# Patient Record
Sex: Female | Born: 1960 | Race: Black or African American | Hispanic: No | Marital: Married | Smoking: Never smoker
Health system: Southern US, Community
[De-identification: ages and names within clinical notes are randomized; demographics above are authoritative.]

## PROBLEM LIST (undated history)

## (undated) DIAGNOSIS — D649 Anemia, unspecified: Secondary | ICD-10-CM

## (undated) DIAGNOSIS — A159 Respiratory tuberculosis unspecified: Secondary | ICD-10-CM

## (undated) DIAGNOSIS — K219 Gastro-esophageal reflux disease without esophagitis: Secondary | ICD-10-CM

---

## 2000-06-09 ENCOUNTER — Emergency Department (HOSPITAL_COMMUNITY): Admission: EM | Admit: 2000-06-09 | Discharge: 2000-06-09 | Payer: Self-pay | Admitting: Emergency Medicine

## 2002-09-11 ENCOUNTER — Other Ambulatory Visit: Admission: RE | Admit: 2002-09-11 | Discharge: 2002-09-11 | Payer: Self-pay | Admitting: Gynecology

## 2003-05-14 ENCOUNTER — Emergency Department (HOSPITAL_COMMUNITY): Admission: EM | Admit: 2003-05-14 | Discharge: 2003-05-14 | Payer: Self-pay | Admitting: Emergency Medicine

## 2004-08-11 ENCOUNTER — Other Ambulatory Visit: Admission: RE | Admit: 2004-08-11 | Discharge: 2004-08-11 | Payer: Self-pay | Admitting: Gynecology

## 2004-08-18 ENCOUNTER — Encounter: Admission: RE | Admit: 2004-08-18 | Discharge: 2004-08-18 | Payer: Self-pay | Admitting: Internal Medicine

## 2006-01-17 ENCOUNTER — Other Ambulatory Visit: Admission: RE | Admit: 2006-01-17 | Discharge: 2006-01-17 | Payer: Self-pay | Admitting: Internal Medicine

## 2006-02-08 ENCOUNTER — Encounter: Admission: RE | Admit: 2006-02-08 | Discharge: 2006-02-08 | Payer: Self-pay | Admitting: Internal Medicine

## 2007-11-20 ENCOUNTER — Other Ambulatory Visit: Admission: RE | Admit: 2007-11-20 | Discharge: 2007-11-20 | Payer: Self-pay | Admitting: Internal Medicine

## 2007-12-06 ENCOUNTER — Encounter: Admission: RE | Admit: 2007-12-06 | Discharge: 2007-12-06 | Payer: Self-pay | Admitting: Internal Medicine

## 2008-11-20 ENCOUNTER — Other Ambulatory Visit: Admission: RE | Admit: 2008-11-20 | Discharge: 2008-11-20 | Payer: Self-pay | Admitting: Internal Medicine

## 2009-02-12 ENCOUNTER — Encounter: Admission: RE | Admit: 2009-02-12 | Discharge: 2009-02-12 | Payer: Self-pay | Admitting: Internal Medicine

## 2010-06-03 ENCOUNTER — Emergency Department (HOSPITAL_COMMUNITY): Admission: EM | Admit: 2010-06-03 | Discharge: 2010-06-03 | Payer: Self-pay | Admitting: Emergency Medicine

## 2010-06-23 ENCOUNTER — Encounter: Admission: RE | Admit: 2010-06-23 | Discharge: 2010-06-23 | Payer: Self-pay | Admitting: Internal Medicine

## 2010-07-14 ENCOUNTER — Emergency Department (HOSPITAL_COMMUNITY): Admission: EM | Admit: 2010-07-14 | Discharge: 2010-07-15 | Payer: Self-pay | Admitting: Emergency Medicine

## 2010-09-11 ENCOUNTER — Encounter
Admission: RE | Admit: 2010-09-11 | Discharge: 2010-09-11 | Payer: Self-pay | Source: Home / Self Care | Attending: Internal Medicine | Admitting: Internal Medicine

## 2010-12-17 ENCOUNTER — Other Ambulatory Visit (HOSPITAL_COMMUNITY)
Admission: RE | Admit: 2010-12-17 | Discharge: 2010-12-17 | Disposition: A | Discharge status: Home / Self Care | Payer: Self-pay | Source: Ambulatory Visit | Attending: Obstetrics and Gynecology | Admitting: Obstetrics and Gynecology

## 2010-12-17 ENCOUNTER — Other Ambulatory Visit: Payer: Self-pay | Admitting: Obstetrics and Gynecology

## 2010-12-17 DIAGNOSIS — Z01419 Encounter for gynecological examination (general) (routine) without abnormal findings: Secondary | ICD-10-CM | POA: Insufficient documentation

## 2010-12-17 LAB — H. PYLORI ANTIBODY, IGG: H Pylori IgG: <0.4 {ISR}

## 2010-12-17 LAB — MAGNESIUM: Magnesium: 2 mg/dL (ref 1.5–2.5)

## 2010-12-17 LAB — POCT CARDIAC MARKERS
CKMB, poc: 1.7 ng/mL (ref 1.0–8.0)
CKMB, poc: 1.8 ng/mL (ref 1.0–8.0)
Myoglobin, poc: 106 ng/mL (ref 12–200)
Myoglobin, poc: 85.4 ng/mL (ref 12–200)
Troponin i, poc: <0.05 ng/mL (ref 0.00–0.09)
Troponin i, poc: <0.05 ng/mL (ref 0.00–0.09)

## 2010-12-17 LAB — COMPREHENSIVE METABOLIC PANEL
ALT: 14 U/L (ref 0–35)
AST: 24 U/L (ref 0–37)
Albumin: 3.5 g/dL (ref 3.5–5.2)
Alkaline Phosphatase: 72 U/L (ref 39–117)
BUN: 13 mg/dL (ref 6–23)
CO2: 28 mEq/L (ref 19–32)
Calcium: 9.3 mg/dL (ref 8.4–10.5)
Chloride: 105 mEq/L (ref 96–112)
Creatinine, Ser: 0.93 mg/dL (ref 0.4–1.2)
GFR calc Af Amer: >60 mL/min (ref >60)
GFR calc non Af Amer: >60 mL/min (ref >60)
Glucose, Bld: 118 mg/dL — ABNORMAL HIGH (ref 70–99)
Potassium: 4.1 mEq/L (ref 3.5–5.1)
Sodium: 137 mEq/L (ref 135–145)
Total Bilirubin: 0.6 mg/dL (ref 0.3–1.2)
Total Protein: 7.2 g/dL (ref 6.0–8.3)

## 2010-12-17 LAB — DIFFERENTIAL
Basophils Absolute: 0 10*3/uL (ref 0.0–0.1)
Basophils Relative: 0 % (ref 0–1)
Eosinophils Absolute: 0.1 10*3/uL (ref 0.0–0.7)
Eosinophils Relative: 2 % (ref 0–5)
Lymphocytes Relative: 34 % (ref 12–46)
Lymphs Abs: 2.8 10*3/uL (ref 0.7–4.0)
Monocytes Absolute: 0.6 10*3/uL (ref 0.1–1.0)
Monocytes Relative: 7 % (ref 3–12)
Neutro Abs: 4.8 10*3/uL (ref 1.7–7.7)
Neutrophils Relative %: 58 % (ref 43–77)

## 2010-12-17 LAB — CBC
HCT: 38.8 % (ref 36.0–46.0)
Hemoglobin: 12.9 g/dL (ref 12.0–15.0)
MCH: 28.9 pg (ref 26.0–34.0)
MCHC: 33.2 g/dL (ref 30.0–36.0)
MCV: 86.8 fL (ref 78.0–100.0)
Platelets: 198 10*3/uL (ref 150–400)
RBC: 4.47 MIL/uL (ref 3.87–5.11)
RDW: 13.5 % (ref 11.5–15.5)
WBC: 8.2 10*3/uL (ref 4.0–10.5)

## 2010-12-18 LAB — DIFFERENTIAL
Basophils Absolute: 0 10*3/uL (ref 0.0–0.1)
Basophils Relative: 1 % (ref 0–1)
Eosinophils Absolute: 0.1 10*3/uL (ref 0.0–0.7)
Eosinophils Relative: 1 % (ref 0–5)
Lymphocytes Relative: 42 % (ref 12–46)
Lymphs Abs: 2.6 10*3/uL (ref 0.7–4.0)
Monocytes Absolute: 0.4 10*3/uL (ref 0.1–1.0)
Monocytes Relative: 7 % (ref 3–12)
Neutro Abs: 3 10*3/uL (ref 1.7–7.7)
Neutrophils Relative %: 49 % (ref 43–77)

## 2010-12-18 LAB — COMPREHENSIVE METABOLIC PANEL
ALT: 17 U/L (ref 0–35)
AST: 24 U/L (ref 0–37)
Albumin: 3.7 g/dL (ref 3.5–5.2)
Alkaline Phosphatase: 69 U/L (ref 39–117)
BUN: 13 mg/dL (ref 6–23)
CO2: 31 mEq/L (ref 19–32)
Calcium: 9.6 mg/dL (ref 8.4–10.5)
Chloride: 106 mEq/L (ref 96–112)
Creatinine, Ser: 1.01 mg/dL (ref 0.4–1.2)
GFR calc Af Amer: >60 mL/min (ref >60)
GFR calc non Af Amer: 58 mL/min — ABNORMAL LOW (ref >60)
Glucose, Bld: 99 mg/dL (ref 70–99)
Potassium: 4.1 mEq/L (ref 3.5–5.1)
Sodium: 141 mEq/L (ref 135–145)
Total Bilirubin: 0.8 mg/dL (ref 0.3–1.2)
Total Protein: 7.1 g/dL (ref 6.0–8.3)

## 2010-12-18 LAB — LIPASE, BLOOD: Lipase: 26 U/L (ref 11–59)

## 2010-12-18 LAB — POCT CARDIAC MARKERS
CKMB, poc: 1.8 ng/mL (ref 1.0–8.0)
Myoglobin, poc: 83 ng/mL (ref 12–200)
Troponin i, poc: <0.05 ng/mL (ref 0.00–0.09)

## 2010-12-18 LAB — CBC
HCT: 36.6 % (ref 36.0–46.0)
Hemoglobin: 12.4 g/dL (ref 12.0–15.0)
MCH: 29.3 pg (ref 26.0–34.0)
MCHC: 33.9 g/dL (ref 30.0–36.0)
MCV: 86.2 fL (ref 78.0–100.0)
Platelets: 228 10*3/uL (ref 150–400)
RBC: 4.25 MIL/uL (ref 3.87–5.11)
RDW: 14.8 % (ref 11.5–15.5)
WBC: 6.2 10*3/uL (ref 4.0–10.5)

## 2011-10-04 ENCOUNTER — Other Ambulatory Visit: Payer: Self-pay | Admitting: Internal Medicine

## 2011-10-04 DIAGNOSIS — Z1231 Encounter for screening mammogram for malignant neoplasm of breast: Secondary | ICD-10-CM

## 2011-10-08 ENCOUNTER — Ambulatory Visit
Admission: RE | Admit: 2011-10-08 | Discharge: 2011-10-08 | Disposition: A | Discharge status: Home / Self Care | Payer: Commercial Indemnity | Source: Ambulatory Visit | Attending: Internal Medicine | Admitting: Internal Medicine

## 2011-10-08 DIAGNOSIS — Z1231 Encounter for screening mammogram for malignant neoplasm of breast: Secondary | ICD-10-CM

## 2013-05-02 ENCOUNTER — Encounter (HOSPITAL_COMMUNITY): Payer: Self-pay | Admitting: *Deleted

## 2013-05-02 ENCOUNTER — Emergency Department (HOSPITAL_COMMUNITY): Payer: BC Managed Care – PPO

## 2013-05-02 ENCOUNTER — Emergency Department (HOSPITAL_COMMUNITY)
Admission: EM | Admit: 2013-05-02 | Discharge: 2013-05-02 | Disposition: A | Discharge status: Home / Self Care | Payer: BC Managed Care – PPO | Attending: Emergency Medicine | Admitting: Emergency Medicine

## 2013-05-02 DIAGNOSIS — Z8709 Personal history of other diseases of the respiratory system: Secondary | ICD-10-CM | POA: Insufficient documentation

## 2013-05-02 DIAGNOSIS — Y9389 Activity, other specified: Secondary | ICD-10-CM | POA: Insufficient documentation

## 2013-05-02 DIAGNOSIS — Y929 Unspecified place or not applicable: Secondary | ICD-10-CM | POA: Insufficient documentation

## 2013-05-02 DIAGNOSIS — R0789 Other chest pain: Secondary | ICD-10-CM

## 2013-05-02 DIAGNOSIS — X500XXA Overexertion from strenuous movement or load, initial encounter: Secondary | ICD-10-CM | POA: Insufficient documentation

## 2013-05-02 DIAGNOSIS — S298XXA Other specified injuries of thorax, initial encounter: Secondary | ICD-10-CM | POA: Insufficient documentation

## 2013-05-02 DIAGNOSIS — Z862 Personal history of diseases of the blood and blood-forming organs and certain disorders involving the immune mechanism: Secondary | ICD-10-CM | POA: Insufficient documentation

## 2013-05-02 HISTORY — DX: Respiratory tuberculosis unspecified: A15.9

## 2013-05-02 HISTORY — DX: Anemia, unspecified: D64.9

## 2013-05-02 LAB — COMPREHENSIVE METABOLIC PANEL
ALT: 15 U/L (ref 0–35)
Albumin: 3.7 g/dL (ref 3.5–5.2)
Alkaline Phosphatase: 99 U/L (ref 39–117)
BUN: 21 mg/dL (ref 6–23)
Chloride: 103 mEq/L (ref 96–112)
GFR calc Af Amer: 65 mL/min — ABNORMAL LOW (ref >90)
Glucose, Bld: 109 mg/dL — ABNORMAL HIGH (ref 70–99)
Potassium: 3.9 mEq/L (ref 3.5–5.1)
Sodium: 139 mEq/L (ref 135–145)
Total Bilirubin: 0.6 mg/dL (ref 0.3–1.2)

## 2013-05-02 LAB — CBC WITH DIFFERENTIAL/PLATELET
Hemoglobin: 13.3 g/dL (ref 12.0–15.0)
Lymphs Abs: 3.6 10*3/uL (ref 0.7–4.0)
Monocytes Relative: 7 % (ref 3–12)
Neutro Abs: 3.2 10*3/uL (ref 1.7–7.7)
Neutrophils Relative %: 43 % (ref 43–77)
RBC: 4.57 MIL/uL (ref 3.87–5.11)
WBC: 7.5 10*3/uL (ref 4.0–10.5)

## 2013-05-02 LAB — TROPONIN I: Troponin I: <0.3 ng/mL (ref <0.30)

## 2013-05-02 MED ORDER — NAPROXEN 500 MG PO TABS: 500.0000 mg | ORAL_TABLET | Freq: Two times a day (BID) | ORAL | Status: DC

## 2013-05-02 NOTE — ED Notes (Signed)
The pt had a sudden onset of lt lower chest pain just under her lt breast approx 1630.  She bent over to pick up an object from the floor and had a severe pain.  Now the pain is worse with breathing and with movement.  No previous history.

## 2013-05-02 NOTE — ED Provider Notes (Signed)
CSN: [id number]     Arrival date & time 05/02/13  1709 History     First MD Initiated Contact with Patient 05/02/13 2043     Chief Complaint  Patient presents with  . Chest Pain   (Consider location/radiation/quality/duration/timing/severity/associated sxs/prior Treatment) HPI Anita Shaw is a 52 y.o. female who presents to ED with complaint of chest pain. Pt states she leaned forward to pick up a book from the floor when she suddenly felt pain in the left chest. States pain is sharp. Does not radiate. Worsened with deep breathing and movement. No pain when laying still. Denies any associated symptoms such as sob, nausea, diaphoresis, dizziness. Stress test 4 years ago and negative. No medical problems. No hx of cardiac or lung issues. Did not take any medications prior to coming in.   Past Medical History  Diagnosis Date  . Anemia   . 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 Cesarean section     History reviewed. No pertinent family history. History  Substance Use Topics  . Smoking status: Never Smoker   . Smokeless tobacco: Not on file  . Alcohol Use: No   OB History   Grav Para Term Preterm Abortions TAB SAB Ect Mult Living                 Review of Systems  Constitutional: Negative for fever and chills.  HENT: Negative for neck pain and neck stiffness.   Respiratory: Negative for cough, chest tightness, shortness of breath and wheezing.   Cardiovascular: Positive for chest pain. Negative for palpitations and leg swelling.  Gastrointestinal: Negative.   Genitourinary: Negative for dysuria and flank pain.  Musculoskeletal: Negative.   Skin: Negative.   Neurological: Negative for dizziness, weakness and headaches.    Allergies  Review of patient's allergies indicates no known allergies.  Home Medications  No current outpatient prescriptions on file. BP 116/74  Pulse 61  Temp(Src) 98.1 F (36.7 C)  (Oral)  Resp 16  SpO2 100% Physical Exam  Nursing note and vitals reviewed. Constitutional: She is oriented to person, place, and time. She appears well-developed and well-nourished. No distress.  Eyes: Conjunctivae are normal.  Neck: Neck supple.  Cardiovascular: Normal rate, regular rhythm and normal heart sounds.   Pulmonary/Chest: Effort normal and breath sounds normal. No respiratory distress. She has no wheezes. She has no rales. She exhibits tenderness.  Tender to palpation over anterior left chest. No bruising, swelling, erythema, rash  Abdominal: Soft. Bowel sounds are normal. She exhibits no distension. There is no tenderness. There is no rebound.  Musculoskeletal: She exhibits no edema.  Neurological: She is alert and oriented to person, place, and time.  Skin: Skin is warm and dry.    ED Course   Procedures (including critical care time)  Labs Reviewed  CBC WITH DIFFERENTIAL - Abnormal; Notable for the following:    Lymphocytes Relative 48 (*)    All other components within normal limits  COMPREHENSIVE METABOLIC PANEL - Abnormal; Notable for the following:    Glucose, Bld 109 (*)    Creatinine, Ser 1.11 (*)    GFR calc non Af Amer 56 (*)    GFR calc Af Amer 65 (*)    All other components within normal limits  TROPONIN I   Dg Chest 2 View  05/02/2013   *RADIOLOGY REPORT*  Clinical Data: Chest pain.  CHEST - 2 VIEW  Comparison:  12/08/2011  Findings: There is new mild cardiomegaly.  Pulmonary vascularity is at the upper limits of normal.  No infiltrates or effusions.  No osseous abnormality.  IMPRESSION: New slight cardiomegaly.   Original Report Authenticated By: Francene Boyers, M.D.    Date: 05/02/2013  Rate: 60  Rhythm: normal sinus rhythm  QRS Axis: normal  Intervals: normal  ST/T Wave abnormalities: nonspecific T wave changes  Conduction Disutrbances:first-degree A-V block   Narrative Interpretation:   Old EKG Reviewed: none available   1. Chest wall pain      MDM  Pt with chest pain after reaching to pick up a book off the floor. She has no risk factors for ACS. Negative stress test 4 years ago. Her pain is there only with movement and deep breathing. VS normal with no tachycardia, hypoxia, tachypnea, doubt pe. Pain is reproducible on exam. Labs as above, troponin negative. Suspect most likely musculoskeletal pain. Home with naprosyn and follow up with PCP.   Filed Vitals:   05/02/13 2048 05/02/13 2049 05/02/13 2100 05/02/13 2115  BP:   111/77 117/72  Pulse:  61 56 53  Temp:      TempSrc:      Resp: 18 16 15 16   SpO2:  100% 100% 100%      Sagal Gayton A Lindaann Gradilla, PA-C 05/03/13 0010

## 2013-05-02 NOTE — Discharge Instructions (Signed)
Your blood work and chest x-ray did not show any concerning findings. I think your chest pain is musculoskeletal. Take naprosyn as prescribed for pain. Avoid strenuous physical activity. Follow up with your doctor as soon as able for recheck.    Chest Pain (Nonspecific) It is often hard to give a specific diagnosis for the cause of chest pain. There is always a chance that your pain could be related to something serious, such as a heart attack or a blood clot in the lungs. You need to follow up with your caregiver for further evaluation. CAUSES   Heartburn.  Pneumonia or bronchitis.  Anxiety or stress.  Inflammation around your heart (pericarditis) or lung (pleuritis or pleurisy).  A blood clot in the lung.  A collapsed lung (pneumothorax). It can develop suddenly on its own (spontaneous pneumothorax) or from injury (trauma) to the chest.  Shingles infection (herpes zoster virus). The chest wall is composed of bones, muscles, and cartilage. Any of these can be the source of the pain.  The bones can be bruised by injury.  The muscles or cartilage can be strained by coughing or overwork.  The cartilage can be affected by inflammation and become sore (costochondritis). DIAGNOSIS  Lab tests or other studies, such as X-rays, electrocardiography, stress testing, or cardiac imaging, may be needed to find the cause of your pain.  TREATMENT   Treatment depends on what may be causing your chest pain. Treatment may include:  Acid blockers for heartburn.  Anti-inflammatory medicine.  Pain medicine for inflammatory conditions.  Antibiotics if an infection is present.  You may be advised to change lifestyle habits. This includes stopping smoking and avoiding alcohol, caffeine, and chocolate.  You may be advised to keep your head raised (elevated) when sleeping. This reduces the chance of acid going backward from your stomach into your esophagus.  Most of the time, nonspecific chest  pain will improve within 2 to 3 days with rest and mild pain medicine. HOME CARE INSTRUCTIONS   If antibiotics were prescribed, take your antibiotics as directed. Finish them even if you start to feel better.  For the next few days, avoid physical activities that bring on chest pain. Continue physical activities as directed.  Do not smoke.  Avoid drinking alcohol.  Only take over-the-counter or prescription medicine for pain, discomfort, or fever as directed by your caregiver.  Follow your caregiver's suggestions for further testing if your chest pain does not go away.  Keep any follow-up appointments you made. If you do not go to an appointment, you could develop lasting (chronic) problems with pain. If there is any problem keeping an appointment, you must call to reschedule. SEEK MEDICAL CARE IF:   You think you are having problems from the medicine you are taking. Read your medicine instructions carefully.  Your chest pain does not go away, even after treatment.  You develop a rash with blisters on your chest. SEEK IMMEDIATE MEDICAL CARE IF:   You have increased chest pain or pain that spreads to your arm, neck, jaw, back, or abdomen.  You develop shortness of breath, an increasing cough, or you are coughing up blood.  You have severe back or abdominal pain, feel nauseous, or vomit.  You develop severe weakness, fainting, or chills.  You have a fever. THIS IS AN EMERGENCY. Do not wait to see if the pain will go away. Get medical help at once. Call your local emergency services (911 in [place] Do not drive yourself to the  hospital. MAKE SURE YOU:   Understand these instructions.  Will watch your condition.  Will get help right away if you are not doing well or get worse. Document Released: 06/30/2005 Document Revised: 12/13/2011 Document Reviewed: 04/25/2008 [facility] Patient Information 2014 [place], [place].

## 2013-05-03 NOTE — ED Provider Notes (Signed)
Medical screening examination/treatment/procedure(s) were performed by non-physician practitioner and as supervising physician I was immediately available for consultation/collaboration.   Loren Racer, MD 05/03/13 [phone]

## 2013-05-04 ENCOUNTER — Other Ambulatory Visit: Payer: Self-pay

## 2013-05-04 DIAGNOSIS — Z1231 Encounter for screening mammogram for malignant neoplasm of breast: Secondary | ICD-10-CM

## 2013-05-15 ENCOUNTER — Other Ambulatory Visit: Payer: Self-pay | Admitting: Nurse Practitioner

## 2013-05-15 ENCOUNTER — Ambulatory Visit
Admission: RE | Admit: 2013-05-15 | Discharge: 2013-05-15 | Disposition: A | Discharge status: Home / Self Care | Payer: BC Managed Care – PPO | Source: Ambulatory Visit

## 2013-05-15 ENCOUNTER — Other Ambulatory Visit (HOSPITAL_COMMUNITY)
Admission: RE | Admit: 2013-05-15 | Discharge: 2013-05-15 | Disposition: A | Discharge status: Home / Self Care | Payer: BC Managed Care – PPO | Source: Ambulatory Visit | Attending: Obstetrics and Gynecology | Admitting: Obstetrics and Gynecology

## 2013-05-15 DIAGNOSIS — Z1151 Encounter for screening for human papillomavirus (HPV): Secondary | ICD-10-CM | POA: Insufficient documentation

## 2013-05-15 DIAGNOSIS — Z1231 Encounter for screening mammogram for malignant neoplasm of breast: Secondary | ICD-10-CM

## 2013-05-15 DIAGNOSIS — Z01419 Encounter for gynecological examination (general) (routine) without abnormal findings: Secondary | ICD-10-CM | POA: Insufficient documentation

## 2014-06-06 ENCOUNTER — Other Ambulatory Visit: Payer: Self-pay

## 2014-06-06 DIAGNOSIS — Z1231 Encounter for screening mammogram for malignant neoplasm of breast: Secondary | ICD-10-CM

## 2014-06-07 ENCOUNTER — Ambulatory Visit
Admission: RE | Admit: 2014-06-07 | Discharge: 2014-06-07 | Disposition: A | Discharge status: Home / Self Care | Payer: BC Managed Care – PPO | Source: Ambulatory Visit

## 2014-06-07 ENCOUNTER — Encounter (INDEPENDENT_AMBULATORY_CARE_PROVIDER_SITE_OTHER): Payer: Self-pay

## 2014-06-07 DIAGNOSIS — Z1231 Encounter for screening mammogram for malignant neoplasm of breast: Secondary | ICD-10-CM

## 2014-06-12 ENCOUNTER — Ambulatory Visit
Admission: RE | Admit: 2014-06-12 | Discharge: 2014-06-12 | Disposition: A | Discharge status: Home / Self Care | Payer: Self-pay | Source: Ambulatory Visit | Attending: Internal Medicine | Admitting: Internal Medicine

## 2014-06-12 ENCOUNTER — Other Ambulatory Visit: Payer: Self-pay | Admitting: Internal Medicine

## 2014-06-12 DIAGNOSIS — Z0000 Encounter for general adult medical examination without abnormal findings: Secondary | ICD-10-CM

## 2015-06-24 DIAGNOSIS — Z862 Personal history of diseases of the blood and blood-forming organs and certain disorders involving the immune mechanism: Secondary | ICD-10-CM | POA: Diagnosis not present

## 2015-06-24 DIAGNOSIS — Z8611 Personal history of tuberculosis: Secondary | ICD-10-CM | POA: Diagnosis not present

## 2015-06-24 DIAGNOSIS — R42 Dizziness and giddiness: Secondary | ICD-10-CM | POA: Insufficient documentation

## 2015-06-24 DIAGNOSIS — R12 Heartburn: Secondary | ICD-10-CM | POA: Diagnosis present

## 2015-06-24 DIAGNOSIS — Z79899 Other long term (current) drug therapy: Secondary | ICD-10-CM | POA: Diagnosis not present

## 2015-06-24 DIAGNOSIS — K219 Gastro-esophageal reflux disease without esophagitis: Secondary | ICD-10-CM | POA: Insufficient documentation

## 2015-06-24 DIAGNOSIS — R208 Other disturbances of skin sensation: Secondary | ICD-10-CM | POA: Insufficient documentation

## 2015-06-25 ENCOUNTER — Emergency Department (HOSPITAL_COMMUNITY)
Admission: EM | Admit: 2015-06-25 | Discharge: 2015-06-25 | Disposition: A | Discharge status: Home / Self Care | Payer: BC Managed Care – PPO | Attending: Emergency Medicine | Admitting: Emergency Medicine

## 2015-06-25 ENCOUNTER — Encounter (HOSPITAL_COMMUNITY): Payer: Self-pay | Admitting: Emergency Medicine

## 2015-06-25 DIAGNOSIS — K219 Gastro-esophageal reflux disease without esophagitis: Secondary | ICD-10-CM

## 2015-06-25 LAB — BASIC METABOLIC PANEL
ANION GAP: 5 (ref 5–15)
BUN: 14 mg/dL (ref 6–20)
CALCIUM: 9.8 mg/dL (ref 8.9–10.3)
CO2: 29 mmol/L (ref 22–32)
Chloride: 105 mmol/L (ref 101–111)
Creatinine, Ser: 0.92 mg/dL (ref 0.44–1.00)
GFR calc Af Amer: >60 mL/min (ref >60)
GLUCOSE: 114 mg/dL — AB (ref 65–99)
Potassium: 4.1 mmol/L (ref 3.5–5.1)
Sodium: 139 mmol/L (ref 135–145)

## 2015-06-25 LAB — URINALYSIS, ROUTINE W REFLEX MICROSCOPIC
BILIRUBIN URINE: NEGATIVE
Glucose, UA: NEGATIVE mg/dL
HGB URINE DIPSTICK: NEGATIVE
Ketones, ur: NEGATIVE mg/dL
Nitrite: NEGATIVE
PROTEIN: NEGATIVE mg/dL
Specific Gravity, Urine: 1.023 (ref 1.005–1.030)
UROBILINOGEN UA: 0.2 mg/dL (ref 0.0–1.0)
pH: 6 (ref 5.0–8.0)

## 2015-06-25 LAB — CBC
HCT: 38.8 % (ref 36.0–46.0)
HEMOGLOBIN: 12.7 g/dL (ref 12.0–15.0)
MCH: 27.8 pg (ref 26.0–34.0)
MCHC: 32.7 g/dL (ref 30.0–36.0)
MCV: 84.9 fL (ref 78.0–100.0)
PLATELETS: 254 10*3/uL (ref 150–400)
RBC: 4.57 MIL/uL (ref 3.87–5.11)
RDW: 13.9 % (ref 11.5–15.5)
WBC: 6.9 10*3/uL (ref 4.0–10.5)

## 2015-06-25 LAB — URINE MICROSCOPIC-ADD ON

## 2015-06-25 LAB — CBG MONITORING, ED: GLUCOSE-CAPILLARY: 124 mg/dL — AB (ref 65–99)

## 2015-06-25 LAB — I-STAT TROPONIN, ED: Troponin i, poc: 0 ng/mL (ref 0.00–0.08)

## 2015-06-25 MED ORDER — GI COCKTAIL ~~LOC~~
30.0000 mL | Freq: Once | ORAL | Status: AC
  Administered 2015-06-25: 30 mL via ORAL
  Filled 2015-06-25: qty 30

## 2015-06-25 MED ORDER — PANTOPRAZOLE SODIUM 40 MG PO TBEC
40.0000 mg | DELAYED_RELEASE_TABLET | Freq: Once | ORAL | Status: AC
  Administered 2015-06-25: 40 mg via ORAL
  Filled 2015-06-25: qty 1

## 2015-06-25 MED ORDER — PANTOPRAZOLE SODIUM 40 MG PO TBEC: DELAYED_RELEASE_TABLET | ORAL | Status: DC

## 2015-06-25 NOTE — ED Provider Notes (Signed)
CSN: [id number]     Arrival date & time 06/24/15  2342 History  This chart was scribed for Rolland Porter, MD by Eustaquio Maize, ED Scribe. This patient was seen in room WA23/WA23 and the patient's care was started at 1:09 AM.  Chief Complaint  Patient presents with  . Heartburn  . Dizziness   The history is provided by the patient. No language interpreter was used.     HPI Comments: Anita Shaw is a 54 y.o. female with hx GERD who presents to the Emergency Department complaining of sudden onset, burning, epigastric pain radiating up to her chest, back, and throat about 8 PM tonight (approximately 5 hours ago). The burning sensation is exacerbated while laying flat. Pt also complains of nausea but denies vomiting and diarrhea. She reports feeling lightheaded as well, causing her concern. Pt has hx of GERD and states tomatoes usually causes her flare ups. Pt  states she did eat a pizza tonight with tomato sauce. She states that she took Pepcid, Gaviscon, and Tums tonight without relief. Pt had similar symptoms 2-3 days ago which resolved after taking Pepcid, Gaviscon, and Tums. Pt denies shortness of breath or any other associated symptoms. She is a never smoker with no EtOH use. Pt used to be on Protonix and Nexium for her GERD but states she has not taken those medications in a few years.    PCP - Lysle Rubens   Past Medical History  Diagnosis Date  . Anemia   . 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 Cesarean section     History reviewed. No pertinent family history. Social History  Substance Use Topics  . Smoking status: Never Smoker   . Smokeless tobacco: None  . Alcohol Use: No   employed  OB History    No data available     Review of Systems  HENT:       Burning sensation in throat  Respiratory: Negative for shortness of breath.   Cardiovascular: Positive for chest pain (Burning sensation).  Gastrointestinal: Positive  for nausea and abdominal pain (Burning sensation). Negative for vomiting and diarrhea.  Musculoskeletal: Positive for back pain (Burning sensation).  Neurological: Positive for dizziness.  All other systems reviewed and are negative.   Allergies  Review of patient's allergies indicates no known allergies.  Home Medications   Prior to Admission medications   Medication Sig Start Date End Date Taking? Authorizing Provider  famotidine (PEPCID) 20 MG tablet Take 20 mg by mouth once.   Yes Historical Provider, MD  Multiple Vitamin (MULTIVITAMIN WITH MINERALS) TABS tablet Take 1 tablet by mouth daily.   Yes Historical Provider, MD  vitamin E 400 UNIT capsule Take 400 Units by mouth daily.   Yes Historical Provider, MD  pantoprazole (PROTONIX) 40 MG tablet Take 1 po BID x 2 weeks then once a day 06/25/15   Rolland Porter, MD   Triage Vitals: BP 135/79 mmHg  Pulse 72  Temp(Src) 98.5 F (36.9 C) (Oral)  Resp 16  Ht 5\' 4"  (1.626 m)  Wt 180 lb (81.647 kg)  BMI 30.88 kg/m2  SpO2 100%   Vital signs normal    Physical Exam  Constitutional: She is oriented to person, place, and time. She appears well-developed and well-nourished.  Non-toxic appearance. She does not appear ill. No distress.  HENT:  Head: Normocephalic and atraumatic.  Right Ear: External ear normal.  Left Ear: External ear  normal.  Nose: Nose normal. No mucosal edema or rhinorrhea.  Mouth/Throat: Oropharynx is clear and moist and mucous membranes are normal. No dental abscesses or uvula swelling.  Eyes: Conjunctivae and EOM are normal. Pupils are equal, round, and reactive to light.  Neck: Normal range of motion and full passive range of motion without pain. Neck supple.  Cardiovascular: Normal rate, regular rhythm and normal heart sounds.  Exam reveals no gallop and no friction rub.   No murmur heard. Pulmonary/Chest: Effort normal and breath sounds normal. No respiratory distress. She has no wheezes. She has no rhonchi. She  has no rales. She exhibits no tenderness and no crepitus.  Abdominal: Soft. Normal appearance and bowel sounds are normal. She exhibits no distension. There is no tenderness. There is no rebound and no guarding.  Abdomen non tender, Pt indicates burning in the epigastric region  Musculoskeletal: Normal range of motion. She exhibits no edema or tenderness.  Moves all extremities well.   Neurological: She is alert and oriented to person, place, and time. She has normal strength. No cranial nerve deficit.  Skin: Skin is warm, dry and intact. No rash noted. No erythema. No pallor.  Psychiatric: She has a normal mood and affect. Her speech is normal and behavior is normal. Her mood appears not anxious.  Nursing note and vitals reviewed.   ED Course  Procedures (including critical care time) Medications  gi cocktail (Maalox,Lidocaine,Donnatal) (30 mLs Oral Given 06/25/15 0130)  pantoprazole (PROTONIX) EC tablet 40 mg (40 mg Oral Given 06/25/15 0130)     DIAGNOSTIC STUDIES: Oxygen Saturation is 100% on RA, normal by my interpretation.    COORDINATION OF CARE: 1:17 AM-Discussed treatment plan which includes GI cocktail with pt at bedside and pt agreed to plan.   2:18 AM-Pt states that the burning sensation has resolved and she is ready to go home. Discussed placing pt on PPI. Pt understands and agrees with plan.   Labs Review Results for orders placed or performed during the hospital encounter of 75/64/33  Basic metabolic panel  Result Value Ref Range   Sodium 139 135 - 145 mmol/L   Potassium 4.1 3.5 - 5.1 mmol/L   Chloride 105 101 - 111 mmol/L   CO2 29 22 - 32 mmol/L   Glucose, Bld 114 (H) 65 - 99 mg/dL   BUN 14 6 - 20 mg/dL   Creatinine, Ser 0.92 0.44 - 1.00 mg/dL   Calcium 9.8 8.9 - 10.3 mg/dL   GFR calc non Af Amer >60 >60 mL/min   GFR calc Af Amer >60 >60 mL/min   Anion gap 5 5 - 15  CBC  Result Value Ref Range   WBC 6.9 4.0 - 10.5 K/uL   RBC 4.57 3.87 - 5.11 MIL/uL    Hemoglobin 12.7 12.0 - 15.0 g/dL   HCT 38.8 36.0 - 46.0 %   MCV 84.9 78.0 - 100.0 fL   MCH 27.8 26.0 - 34.0 pg   MCHC 32.7 30.0 - 36.0 g/dL   RDW 13.9 11.5 - 15.5 %   Platelets 254 150 - 400 K/uL  Urinalysis, Routine w reflex microscopic (not at [facility])  Result Value Ref Range   Color, Urine YELLOW YELLOW   APPearance CLOUDY (A) CLEAR   Specific Gravity, Urine 1.023 1.005 - 1.030   pH 6.0 5.0 - 8.0   Glucose, UA NEGATIVE NEGATIVE mg/dL   Hgb urine dipstick NEGATIVE NEGATIVE   Bilirubin Urine NEGATIVE NEGATIVE   Ketones, ur NEGATIVE NEGATIVE mg/dL  Protein, ur NEGATIVE NEGATIVE mg/dL   Urobilinogen, UA 0.2 0.0 - 1.0 mg/dL   Nitrite NEGATIVE NEGATIVE   Leukocytes, UA MODERATE (A) NEGATIVE  Urine microscopic-add on  Result Value Ref Range   Squamous Epithelial / LPF MANY (A) RARE   WBC, UA 11-20 <3 WBC/hpf   Bacteria, UA MANY (A) RARE  CBG monitoring, ED  Result Value Ref Range   Glucose-Capillary 124 (H) 65 - 99 mg/dL  I-Stat Troponin, ED (not at [facility])  Result Value Ref Range   Troponin i, poc 0.00 0.00 - 0.08 ng/mL   Comment 3           Laboratory interpretation all normal except contaminated urinalysis     Imaging Review No results found. I have personally reviewed and evaluated these images and lab results as part of my medical decision-making.   EKG Interpretation   Date/Time:  Wednesday June 25 2015 00:18:51 EDT Ventricular Rate:  70 PR Interval:  192 QRS Duration: 77 QT Interval:  390 QTC Calculation: 421 R Axis:   72 Text Interpretation:  Sinus rhythm Probable left atrial enlargement  Otherwise within normal limits No significant change since last tracing 22 Apr 2013 Confirmed by KNAPP  MD-I, IVA ([id number]) on 06/25/2015 12:45:29 AM      MDM   Final diagnoses:  Gastroesophageal reflux disease without esophagitis   Discharge Medication List as of 06/25/2015  2:29 AM    START taking these medications   Details  pantoprazole (PROTONIX) 40 MG tablet  Take 1 po BID x 2 weeks then once a day, Print        Plan discharge  Rolland Porter, MD, FACEP   I personally performed the services described in this documentation, which was scribed in my presence. The recorded information has been reviewed and considered.  Rolland Porter, MD, Barbette Or, MD 06/25/15 [phone]

## 2015-06-25 NOTE — ED Notes (Signed)
MD at bedside. 

## 2015-06-25 NOTE — ED Notes (Signed)
Pt did not want an IV

## 2015-06-25 NOTE — ED Notes (Signed)
Delay in lab draw, pt enroute to room 23

## 2015-06-25 NOTE — ED Notes (Signed)
Pt is unable to give a urine sample at this time. Pt is aware that a sample is needed.

## 2015-06-25 NOTE — ED Notes (Signed)
Pt reporting heartburn and dizziness starting tonight. Ate pizza tonight. Says she took Prevacid and another anti-acid tonight. Endorses nausea but no vomiting or diarrhea. A&Ox4. RR even/unlabored. Denies chest pain/SOB.

## 2015-06-25 NOTE — Discharge Instructions (Signed)
Avoid the things you know make your reflux worse such as the tomatoes. Take the protonix as prescribed. Follow up with [facility] if your symptoms are not improving. Return to the ED if you feel a lot worse.    Food Choices for Gastroesophageal Reflux Disease When you have gastroesophageal reflux disease (GERD), the foods you eat and your eating habits are very important. Choosing the right foods can help ease the discomfort of GERD. WHAT GENERAL GUIDELINES DO I NEED TO FOLLOW?  Choose fruits, vegetables, whole grains, low-fat dairy products, and low-fat meat, fish, and poultry.  Limit fats such as oils, salad dressings, butter, nuts, and avocado.  Keep a food diary to identify foods that cause symptoms.  Avoid foods that cause reflux. These may be different for different people.  Eat frequent small meals instead of three large meals each day.  Eat your meals slowly, in a relaxed setting.  Limit fried foods.  Cook foods using methods other than frying.  Avoid drinking alcohol.  Avoid drinking large amounts of liquids with your meals.  Avoid bending over or lying down until 2-3 hours after eating. WHAT FOODS ARE NOT RECOMMENDED? The following are some foods and drinks that may worsen your symptoms: Vegetables Tomatoes. Tomato juice. Tomato and spaghetti sauce. Chili peppers. Onion and garlic. Horseradish. Fruits Oranges, grapefruit, and lemon (fruit and juice). Meats High-fat meats, fish, and poultry. This includes hot dogs, ribs, ham, sausage, salami, and bacon. Dairy Whole milk and chocolate milk. Sour cream. Cream. Butter. Ice cream. Cream cheese.  Beverages Coffee and tea, with or without caffeine. Carbonated beverages or energy drinks. Condiments Hot sauce. Barbecue sauce.  Sweets/Desserts Chocolate and cocoa. Donuts. Peppermint and spearmint. Fats and Oils High-fat foods, including [place] fries and potato chips. Other Vinegar. Strong spices, such as  black pepper, white pepper, red pepper, cayenne, curry powder, cloves, ginger, and chili powder. The items listed above may not be a complete list of foods and beverages to avoid. Contact your dietitian for more information. Document Released: 09/20/2005 Document Revised: 09/25/2013 Document Reviewed: 07/25/2013 [facility] Patient Information 2015 [place], [place]. This information is not intended to replace advice given to you by your health care provider. Make sure you discuss any questions you have with your health care provider.  Gastroesophageal Reflux Disease, Adult Gastroesophageal reflux disease (GERD) happens when acid from your stomach flows up into the esophagus. When acid comes in contact with the esophagus, the acid causes soreness (inflammation) in the esophagus. Over time, GERD may create small holes (ulcers) in the lining of the esophagus. CAUSES   Increased body weight. This puts pressure on the stomach, making acid rise from the stomach into the esophagus.  Smoking. This increases acid production in the stomach.  Drinking alcohol. This causes decreased pressure in the lower esophageal sphincter (valve or ring of muscle between the esophagus and stomach), allowing acid from the stomach into the esophagus.  Late evening meals and a full stomach. This increases pressure and acid production in the stomach.  A malformed lower esophageal sphincter. Sometimes, no cause is found. SYMPTOMS   Burning pain in the lower part of the mid-chest behind the breastbone and in the mid-stomach area. This may occur twice a week or more often.  Trouble swallowing.  Sore throat.  Dry cough.  Asthma-like symptoms including chest tightness, shortness of breath, or wheezing. DIAGNOSIS  Your caregiver may be able to diagnose GERD based on your symptoms. In some cases, X-rays and other tests may be  done to check for complications or to check the condition of your stomach and esophagus. TREATMENT    Your caregiver may recommend over-the-counter or prescription medicines to help decrease acid production. Ask your caregiver before starting or adding any new medicines.  HOME CARE INSTRUCTIONS   Change the factors that you can control. Ask your caregiver for guidance concerning weight loss, quitting smoking, and alcohol consumption.  Avoid foods and drinks that make your symptoms worse, such as:  Caffeine or alcoholic drinks.  Chocolate.  Peppermint or mint flavorings.  Garlic and onions.  Spicy foods.  Citrus fruits, such as oranges, lemons, or limes.  Tomato-based foods such as sauce, chili, salsa, and pizza.  Fried and fatty foods.  Avoid lying down for the 3 hours prior to your bedtime or prior to taking a nap.  Eat small, frequent meals instead of large meals.  Wear loose-fitting clothing. Do not wear anything tight around your waist that causes pressure on your stomach.  Raise the head of your bed 6 to 8 inches with wood blocks to help you sleep. Extra pillows will not help.  Only take over-the-counter or prescription medicines for pain, discomfort, or fever as directed by your caregiver.  Do not take aspirin, ibuprofen, or other nonsteroidal anti-inflammatory drugs (NSAIDs). SEEK IMMEDIATE MEDICAL CARE IF:   You have pain in your arms, neck, jaw, teeth, or back.  Your pain increases or changes in intensity or duration.  You develop nausea, vomiting, or sweating (diaphoresis).  You develop shortness of breath, or you faint.  Your vomit is green, yellow, black, or looks like coffee grounds or blood.  Your stool is red, bloody, or black. These symptoms could be signs of other problems, such as heart disease, gastric bleeding, or esophageal bleeding. MAKE SURE YOU:   Understand these instructions.  Will watch your condition.  Will get help right away if you are not doing well or get worse. Document Released: 06/30/2005 Document Revised: 12/13/2011 Document  Reviewed: 04/09/2011 [facility] Patient Information 2015 [place], [place]. This information is not intended to replace advice given to you by your health care provider. Make sure you discuss any questions you have with your health care provider.

## 2015-10-01 ENCOUNTER — Other Ambulatory Visit: Payer: Self-pay

## 2015-10-01 DIAGNOSIS — Z1231 Encounter for screening mammogram for malignant neoplasm of breast: Secondary | ICD-10-CM

## 2015-10-15 ENCOUNTER — Ambulatory Visit: Payer: BC Managed Care – PPO

## 2015-10-23 ENCOUNTER — Ambulatory Visit: Payer: BC Managed Care – PPO

## 2015-10-23 ENCOUNTER — Ambulatory Visit
Admission: RE | Admit: 2015-10-23 | Discharge: 2015-10-23 | Disposition: A | Discharge status: Home / Self Care | Payer: BC Managed Care – PPO | Source: Ambulatory Visit

## 2015-10-23 DIAGNOSIS — Z1231 Encounter for screening mammogram for malignant neoplasm of breast: Secondary | ICD-10-CM

## 2015-11-04 ENCOUNTER — Other Ambulatory Visit: Payer: Self-pay | Admitting: Nurse Practitioner

## 2015-11-04 ENCOUNTER — Other Ambulatory Visit (HOSPITAL_COMMUNITY)
Admission: RE | Admit: 2015-11-04 | Discharge: 2015-11-04 | Disposition: A | Discharge status: Home / Self Care | Payer: BC Managed Care – PPO | Source: Ambulatory Visit | Attending: Nurse Practitioner | Admitting: Nurse Practitioner

## 2015-11-04 DIAGNOSIS — Z01419 Encounter for gynecological examination (general) (routine) without abnormal findings: Secondary | ICD-10-CM | POA: Diagnosis present

## 2015-11-04 DIAGNOSIS — Z1151 Encounter for screening for human papillomavirus (HPV): Secondary | ICD-10-CM | POA: Diagnosis present

## 2015-11-06 LAB — CYTOLOGY - PAP

## 2015-12-31 ENCOUNTER — Other Ambulatory Visit: Payer: Self-pay | Admitting: Gastroenterology

## 2016-02-09 ENCOUNTER — Encounter (HOSPITAL_COMMUNITY): Payer: Self-pay | Admitting: *Deleted

## 2016-02-16 ENCOUNTER — Encounter (HOSPITAL_COMMUNITY)
Admission: RE | Disposition: A | Discharge status: Home / Self Care | Payer: Self-pay | Source: Ambulatory Visit | Attending: Gastroenterology

## 2016-02-16 ENCOUNTER — Ambulatory Visit (HOSPITAL_COMMUNITY)
Admission: RE | Admit: 2016-02-16 | Discharge: 2016-02-16 | Disposition: A | Discharge status: Home / Self Care | Payer: BC Managed Care – PPO | Source: Ambulatory Visit | Attending: Gastroenterology | Admitting: Gastroenterology

## 2016-02-16 ENCOUNTER — Encounter (HOSPITAL_COMMUNITY): Payer: Self-pay | Admitting: *Deleted

## 2016-02-16 ENCOUNTER — Encounter (HOSPITAL_COMMUNITY): Payer: Self-pay | Admitting: Anesthesiology

## 2016-02-16 DIAGNOSIS — Z800 Family history of malignant neoplasm of digestive organs: Secondary | ICD-10-CM | POA: Diagnosis not present

## 2016-02-16 DIAGNOSIS — Z1211 Encounter for screening for malignant neoplasm of colon: Secondary | ICD-10-CM | POA: Insufficient documentation

## 2016-02-16 DIAGNOSIS — K621 Rectal polyp: Secondary | ICD-10-CM | POA: Diagnosis not present

## 2016-02-16 HISTORY — DX: Gastro-esophageal reflux disease without esophagitis: K21.9

## 2016-02-16 HISTORY — PX: COLONOSCOPY WITH PROPOFOL: SHX5780

## 2016-02-16 SURGERY — COLONOSCOPY WITH PROPOFOL
Anesthesia: Monitor Anesthesia Care

## 2016-02-16 MED ORDER — SODIUM CHLORIDE 0.9 % IV SOLN: INTRAVENOUS | Status: DC

## 2016-02-16 MED ORDER — FENTANYL CITRATE (PF) 100 MCG/2ML IJ SOLN
INTRAMUSCULAR | Status: DC | PRN
  Administered 2016-02-16: 25 ug via INTRAVENOUS
  Administered 2016-02-16: 50 ug via INTRAVENOUS

## 2016-02-16 MED ORDER — MIDAZOLAM HCL 5 MG/5ML IJ SOLN
INTRAMUSCULAR | Status: DC | PRN
  Administered 2016-02-16: 1 mg via INTRAVENOUS
  Administered 2016-02-16: 2 mg via INTRAVENOUS
  Administered 2016-02-16: 2.5 mg via INTRAVENOUS
  Administered 2016-02-16 (×2): 1.5 mg via INTRAVENOUS

## 2016-02-16 MED ORDER — FENTANYL CITRATE (PF) 100 MCG/2ML IJ SOLN
INTRAMUSCULAR | Status: AC
  Filled 2016-02-16: qty 2

## 2016-02-16 MED ORDER — MIDAZOLAM HCL 5 MG/ML IJ SOLN
INTRAMUSCULAR | Status: AC
  Filled 2016-02-16: qty 2

## 2016-02-16 MED ORDER — LACTATED RINGERS IV SOLN
INTRAVENOUS | Status: DC
  Administered 2016-02-16: 1000 mL via INTRAVENOUS

## 2016-02-16 MED ORDER — DIPHENHYDRAMINE HCL 50 MG/ML IJ SOLN
INTRAMUSCULAR | Status: AC
  Filled 2016-02-16: qty 1

## 2016-02-16 SURGICAL SUPPLY — 21 items

## 2016-02-16 NOTE — H&P (Signed)
  Procedure: Screening colonoscopy. Sister was diagnosed with colon cancer at age 56. Normal screening colonoscopy was performed on 03/10/2010  History: The patient is a 55 year old female born 05/03/1961. She is scheduled to undergo a repeat screening colonoscopy today.  Past medical history: Cesarean sections. 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 Hypercholesterolemia. Gastroesophageal reflux.  Medication allergies: None  Family history: Father diagnosed with colon cancer. Sister diagnosed with colon cancer at age 54.  Exam: The patient is alert and lying comfortably on the endoscopy stretcher. Abdomen is soft and nontender to palpation. Lungs are clear to auscultation. Cardiac exam reveals a regular rhythm.  Plan: Proceed with screening colonoscopy using Versed and fentanyl

## 2016-02-16 NOTE — Anesthesia Preprocedure Evaluation (Deleted)
Anesthesia Evaluation  Patient identified by MRN, date of birth, ID band Patient awake    Reviewed: Allergy & Precautions, NPO status , Patient's Chart, lab work & pertinent test results  Airway        Dental   Pulmonary neg pulmonary ROS,           Cardiovascular negative cardio ROS       Neuro/Psych negative neurological ROS     GI/Hepatic Neg liver ROS, GERD  ,  Endo/Other  negative endocrine ROS  Renal/GU negative Renal ROS     Musculoskeletal negative musculoskeletal ROS (+)   Abdominal   Peds  Hematology negative hematology ROS (+) anemia ,   Anesthesia Other Findings   Reproductive/Obstetrics                             Anesthesia Physical Anesthesia Plan Anesthesia Quick Evaluation

## 2016-02-16 NOTE — Op Note (Signed)
[facility] Patient Name: Anita Shaw Procedure Date: 02/16/2016 MRN: [id number] Attending MD: Garlan Fair , MD Date of Birth: 1961-09-22 CSN: [id number] Age: 55 Admit Type: Outpatient Procedure:                Colonoscopy Indications:              Screening in patient at increased risk: Family                            history of 1st-degree relative with colorectal                            cancer before age 34 years Providers:                Garlan Fair, MD, Laverta Baltimore, RN,                            Vista Lawman, RN Referring MD:              Medicines:                Fentanyl 75 micrograms IV, Midazolam 7.5 mg IV Complications:            No immediate complications. Estimated Blood Loss:     Estimated blood loss: none. Procedure:                Pre-Anesthesia Assessment:                           - Prior to the procedure, a History and Physical                            was performed, and patient medications and                            allergies were reviewed. The patient's tolerance of                            previous anesthesia was also reviewed. The risks                            and benefits of the procedure and the sedation                            options and risks were discussed with the patient.                            All questions were answered, and informed consent                            was obtained. Prior Anticoagulants: The patient has                            taken no previous anticoagulant or antiplatelet  agents. ASA Grade Assessment: II - A patient with                            mild systemic disease. After reviewing the risks                            and benefits, the patient was deemed in                            satisfactory condition to undergo the procedure.                           After obtaining informed consent, the colonoscope                            was passed  under direct vision. Throughout the                            procedure, the patient's blood pressure, pulse, and                            oxygen saturations were monitored continuously. The                            Colonoscope was introduced through the anus and                            advanced to the the cecum, identified by                            appendiceal orifice and ileocecal valve. The                            colonoscopy was technically difficult and complex                            due to significant looping. The patient tolerated                            the procedure well. The quality of the bowel                            preparation was good. The ileocecal valve, the                            appendiceal orifice and the rectum were                            photographed. Scope In: 11:19:26 AM Scope Out: 11:48:36 AM Scope Withdrawal Time: 0 hours 11 minutes 9 seconds  Total Procedure Duration: 0 hours 29 minutes 10 seconds  Findings:      The perianal and digital rectal examinations were normal.      A 3 mm polyp was found in the rectum. The polyp  was sessile. The polyp       was removed with a cold biopsy forceps. Resection and retrieval were       complete.      The exam was otherwise without abnormality. Impression:               - One 3 mm polyp in the rectum, removed with a cold                            biopsy forceps. Resected and retrieved.                           - The examination was otherwise normal. Moderate Sedation:      Moderate (conscious) sedation was personally administered by the       endoscopist. The following parameters were monitored: oxygen saturation,       heart rate, blood pressure, and response to care. Recommendation:           - Patient has a contact number available for                            emergencies. The signs and symptoms of potential                            delayed complications were discussed with the                             patient. Return to normal activities tomorrow.                            Written discharge instructions were provided to the                            patient.                           - Repeat colonoscopy in 5 years for screening                            purposes.                           - Resume previous diet.                           - Continue present medications. Procedure Code(s):        --- Professional ---                           [phone], Colonoscopy, flexible; with biopsy, single                            or multiple Diagnosis Code(s):        --- Professional ---                           Z80.0, Family history of malignant neoplasm of  digestive organs                           K62.1, Rectal polyp CPT copyright 2016 [employer]. All rights reserved. The codes documented in this report are preliminary and upon coder review may  be revised to meet current compliance requirements. Earle Gell, MD Garlan Fair, MD 02/16/2016 11:55:09 AM This report has been signed electronically. Number of Addenda: 0

## 2016-02-16 NOTE — Discharge Instructions (Signed)

## 2016-02-17 ENCOUNTER — Encounter (HOSPITAL_COMMUNITY): Payer: Self-pay | Admitting: Gastroenterology

## 2016-11-08 ENCOUNTER — Other Ambulatory Visit: Payer: Self-pay | Admitting: Internal Medicine

## 2016-11-08 ENCOUNTER — Ambulatory Visit
Admission: RE | Admit: 2016-11-08 | Discharge: 2016-11-08 | Disposition: A | Discharge status: Home / Self Care | Payer: BC Managed Care – PPO | Source: Ambulatory Visit | Attending: Internal Medicine | Admitting: Internal Medicine

## 2016-11-08 DIAGNOSIS — R7611 Nonspecific reaction to tuberculin skin test without active tuberculosis: Secondary | ICD-10-CM

## 2016-12-13 ENCOUNTER — Other Ambulatory Visit: Payer: Self-pay | Admitting: Internal Medicine

## 2016-12-13 DIAGNOSIS — Z1231 Encounter for screening mammogram for malignant neoplasm of breast: Secondary | ICD-10-CM

## 2016-12-30 ENCOUNTER — Ambulatory Visit
Admission: RE | Admit: 2016-12-30 | Discharge: 2016-12-30 | Disposition: A | Discharge status: Home / Self Care | Payer: BC Managed Care – PPO | Source: Ambulatory Visit | Attending: Internal Medicine | Admitting: Internal Medicine

## 2016-12-30 DIAGNOSIS — Z1231 Encounter for screening mammogram for malignant neoplasm of breast: Secondary | ICD-10-CM

## 2017-05-16 IMAGING — CR DG CHEST 2V
2 series · 2 of 2 positions shown · non-contrast
Comparison: Chest x-ray of June 12, 2014

CLINICAL DATA: Positive PPD.  No current chest symptoms.

EXAM:
CHEST  2 VIEW

[w chest pa]
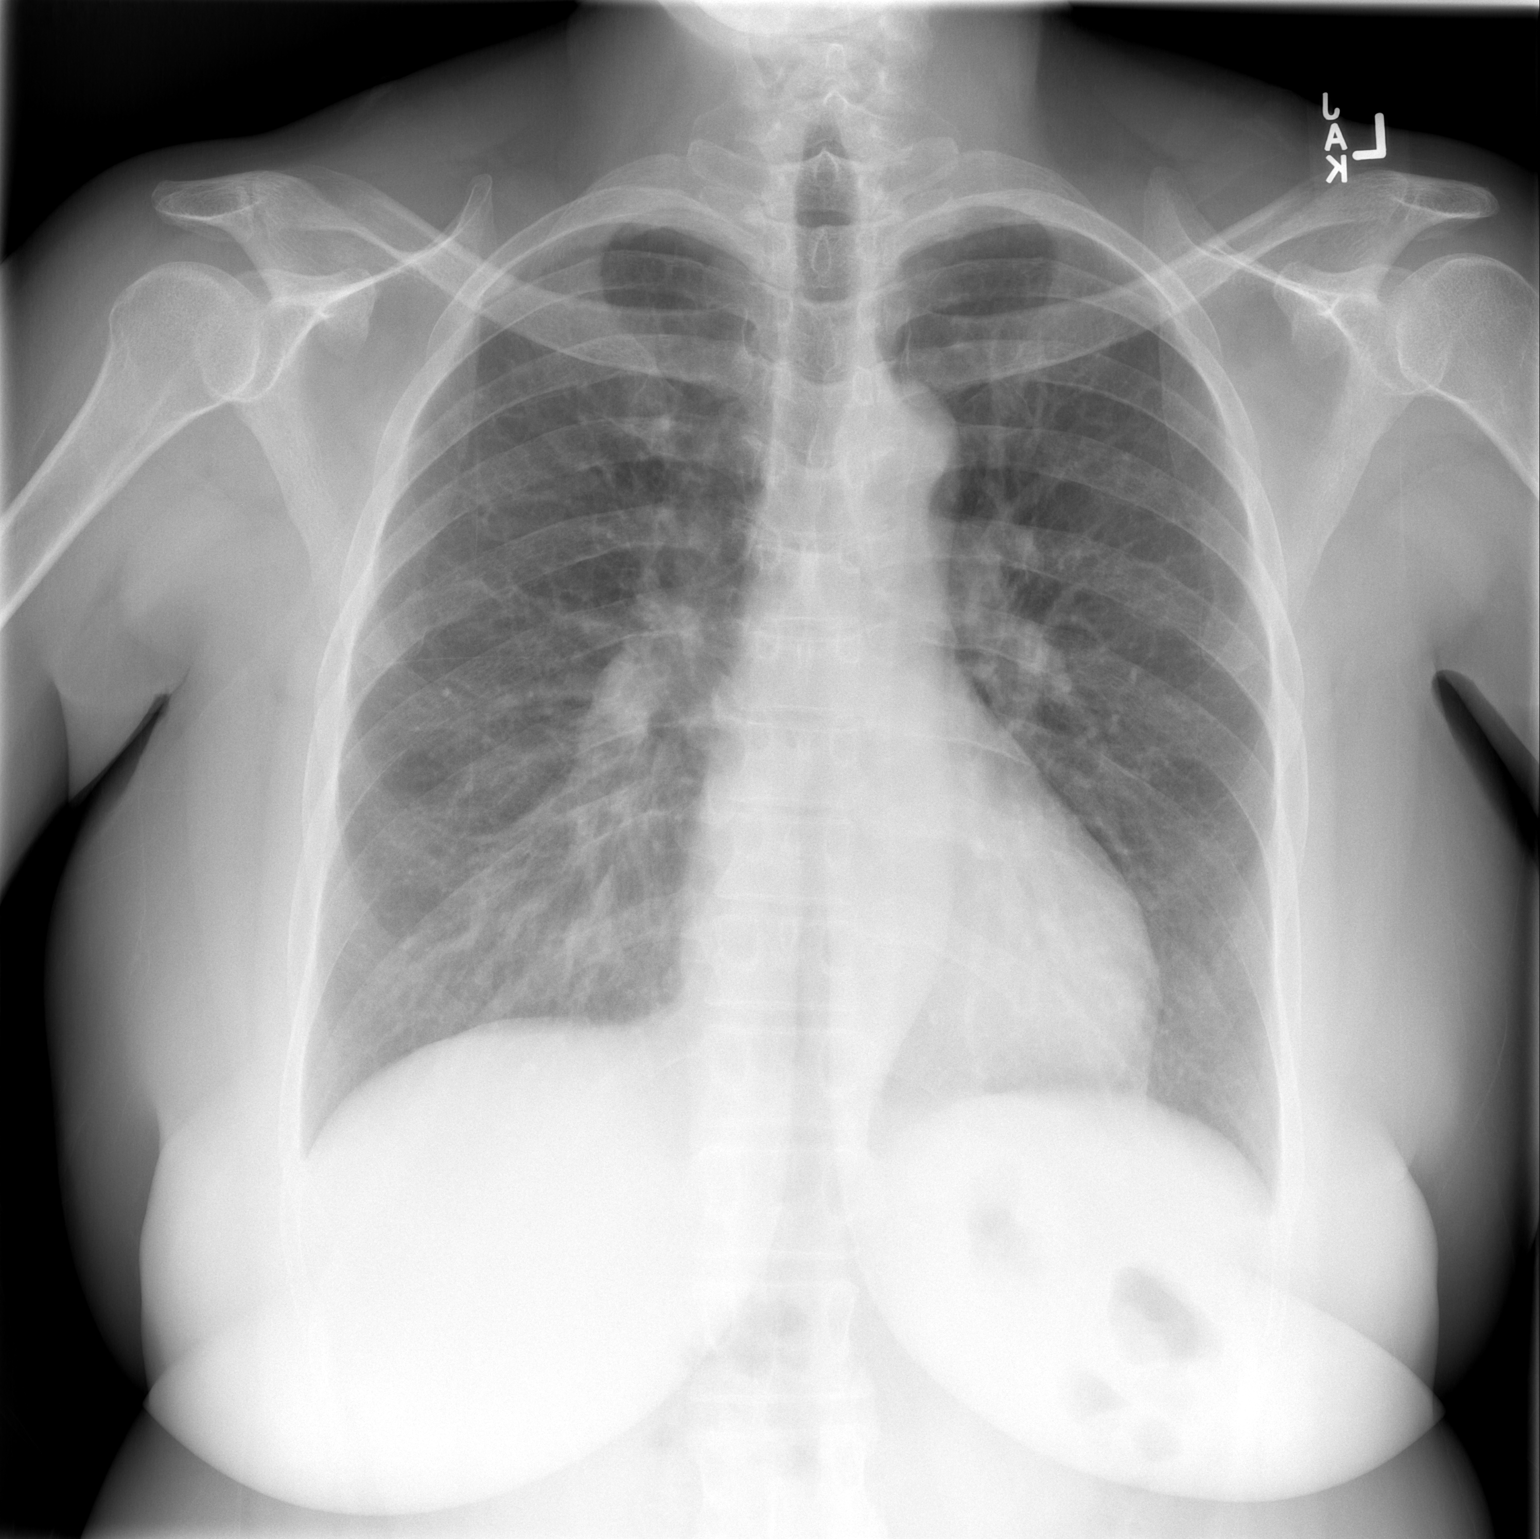

[w chest lat]
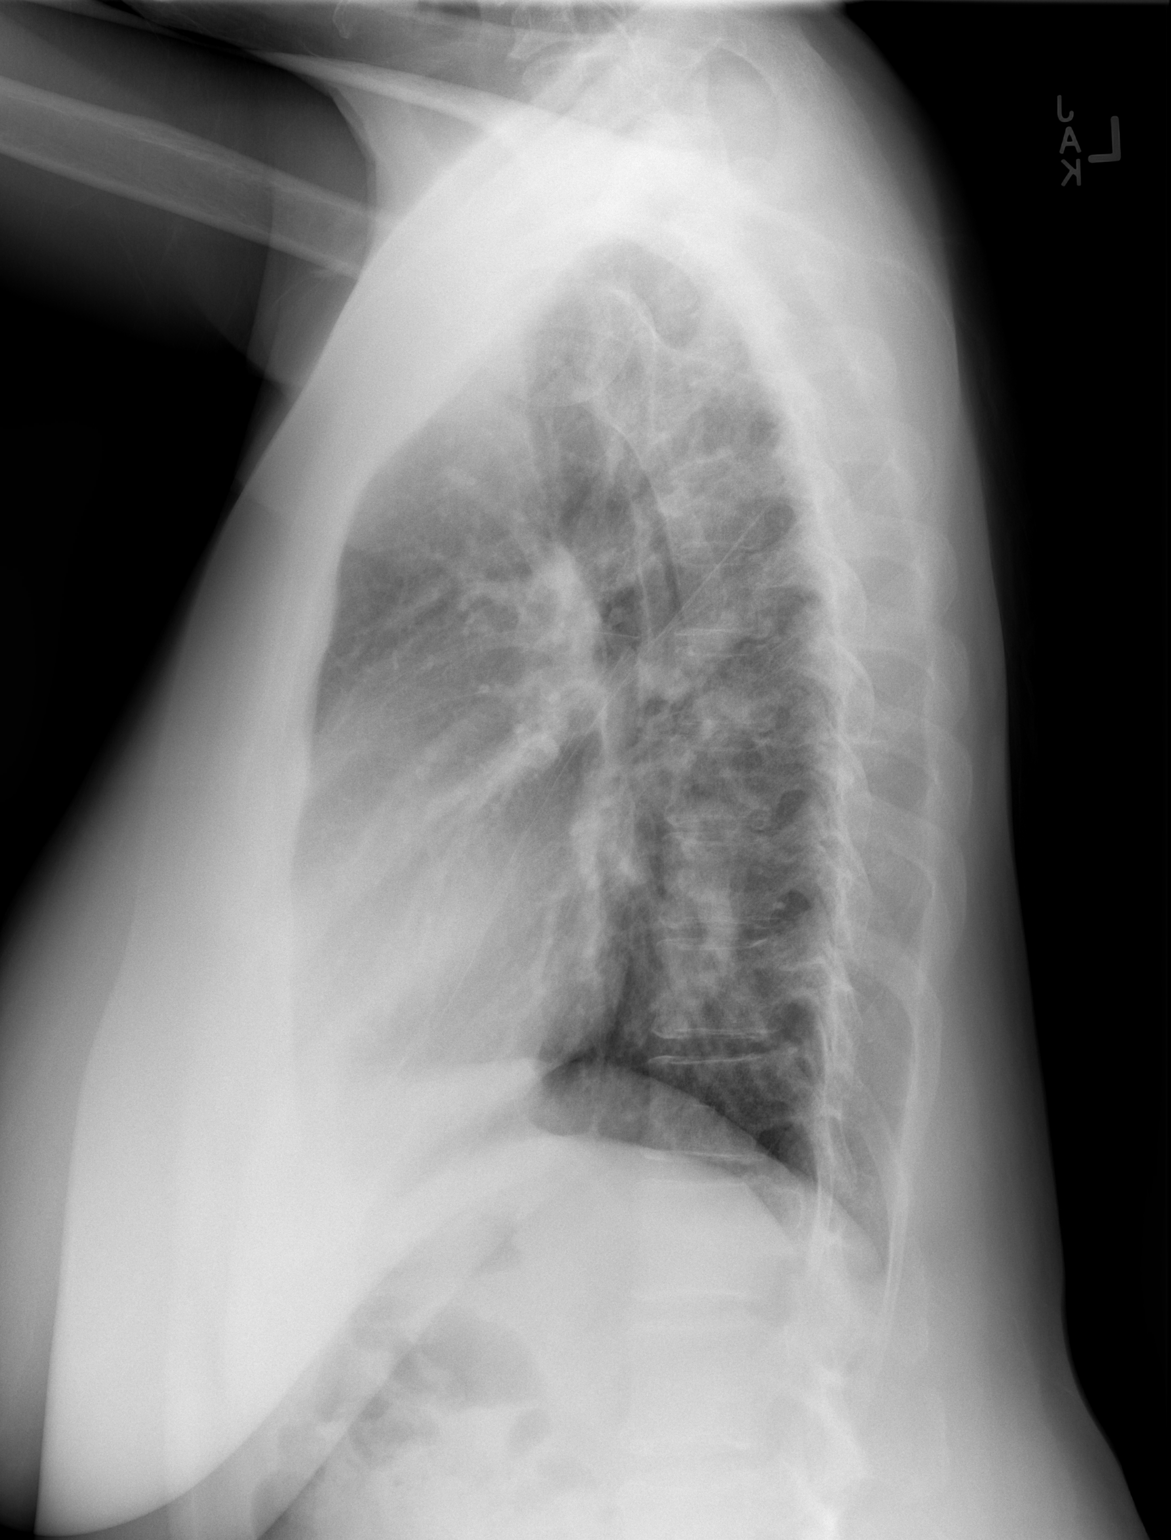

[2 of 2 positions shown; findings below may reference images not displayed]

FINDINGS: The lungs are well-expanded. The interstitial markings are mildly
prominent though stable. There is no alveolar infiltrate or pleural
effusion. No calcified granulomas or cavitary lesions are observed.
The heart and pulmonary vascularity are normal. The mediastinum is
normal in width. There is no pleural effusion. The bony thorax is
unremarkable.
IMPRESSION: There is no evidence of acute or old tuberculous infection. Mild
chronic interstitial prominence is present and stable.

## 2017-10-18 ENCOUNTER — Other Ambulatory Visit (HOSPITAL_COMMUNITY)
Admission: RE | Admit: 2017-10-18 | Discharge: 2017-10-18 | Disposition: A | Discharge status: Home / Self Care | Payer: BC Managed Care – PPO | Source: Ambulatory Visit | Attending: Nurse Practitioner | Admitting: Nurse Practitioner

## 2017-10-18 ENCOUNTER — Other Ambulatory Visit: Payer: Self-pay | Admitting: Nurse Practitioner

## 2017-10-18 DIAGNOSIS — Z01419 Encounter for gynecological examination (general) (routine) without abnormal findings: Secondary | ICD-10-CM | POA: Diagnosis present

## 2017-10-19 LAB — CYTOLOGY - PAP
Diagnosis: NEGATIVE
HPV: NOT DETECTED

## 2017-12-14 ENCOUNTER — Other Ambulatory Visit: Payer: Self-pay | Admitting: Internal Medicine

## 2017-12-14 DIAGNOSIS — Z1231 Encounter for screening mammogram for malignant neoplasm of breast: Secondary | ICD-10-CM

## 2018-01-03 ENCOUNTER — Ambulatory Visit
Admission: RE | Admit: 2018-01-03 | Discharge: 2018-01-03 | Disposition: A | Discharge status: Home / Self Care | Payer: BC Managed Care – PPO | Source: Ambulatory Visit | Attending: Internal Medicine | Admitting: Internal Medicine

## 2018-01-03 DIAGNOSIS — Z1231 Encounter for screening mammogram for malignant neoplasm of breast: Secondary | ICD-10-CM

## 2019-11-22 ENCOUNTER — Other Ambulatory Visit: Payer: Self-pay | Admitting: Internal Medicine

## 2019-11-22 DIAGNOSIS — Z1231 Encounter for screening mammogram for malignant neoplasm of breast: Secondary | ICD-10-CM

## 2019-12-25 ENCOUNTER — Other Ambulatory Visit: Payer: Self-pay

## 2019-12-25 ENCOUNTER — Ambulatory Visit
Admission: RE | Admit: 2019-12-25 | Discharge: 2019-12-25 | Disposition: A | Discharge status: Home / Self Care | Payer: BC Managed Care – PPO | Source: Ambulatory Visit | Attending: Internal Medicine | Admitting: Internal Medicine

## 2019-12-25 DIAGNOSIS — Z1231 Encounter for screening mammogram for malignant neoplasm of breast: Secondary | ICD-10-CM

## 2020-11-27 ENCOUNTER — Other Ambulatory Visit: Payer: Self-pay | Admitting: Internal Medicine

## 2020-11-27 DIAGNOSIS — Z1231 Encounter for screening mammogram for malignant neoplasm of breast: Secondary | ICD-10-CM

## 2021-01-19 ENCOUNTER — Ambulatory Visit: Payer: BC Managed Care – PPO

## 2021-01-30 ENCOUNTER — Other Ambulatory Visit: Payer: Self-pay | Admitting: Gastroenterology

## 2021-03-05 ENCOUNTER — Other Ambulatory Visit: Payer: Self-pay

## 2021-03-05 ENCOUNTER — Ambulatory Visit
Admission: RE | Admit: 2021-03-05 | Discharge: 2021-03-05 | Disposition: A | Discharge status: Home / Self Care | Payer: Self-pay | Source: Ambulatory Visit | Attending: Internal Medicine | Admitting: Internal Medicine

## 2021-03-05 DIAGNOSIS — Z1231 Encounter for screening mammogram for malignant neoplasm of breast: Secondary | ICD-10-CM

## 2021-03-12 ENCOUNTER — Other Ambulatory Visit: Payer: Self-pay | Admitting: Otolaryngology

## 2021-03-12 DIAGNOSIS — D490 Neoplasm of unspecified behavior of digestive system: Secondary | ICD-10-CM

## 2021-03-26 ENCOUNTER — Ambulatory Visit
Admission: RE | Admit: 2021-03-26 | Discharge: 2021-03-26 | Disposition: A | Discharge status: Home / Self Care | Payer: BC Managed Care – PPO | Source: Ambulatory Visit | Attending: Otolaryngology | Admitting: Otolaryngology

## 2021-03-26 DIAGNOSIS — D490 Neoplasm of unspecified behavior of digestive system: Secondary | ICD-10-CM

## 2021-04-13 ENCOUNTER — Other Ambulatory Visit: Payer: Self-pay | Admitting: Otolaryngology

## 2021-05-05 ENCOUNTER — Ambulatory Visit (HOSPITAL_COMMUNITY)
Admission: RE | Admit: 2021-05-05 | Payer: BC Managed Care – PPO | Source: Home / Self Care | Admitting: Gastroenterology

## 2021-05-05 ENCOUNTER — Encounter (HOSPITAL_COMMUNITY): Admission: RE | Payer: Self-pay | Source: Home / Self Care

## 2021-05-05 SURGERY — COLONOSCOPY WITH PROPOFOL
Anesthesia: Monitor Anesthesia Care

## 2021-10-01 IMAGING — CT CT NECK W/O CM
3 of 4 series · 6 of 14 positions shown, 7 images · non-contrast
Comparison: None.

CLINICAL DATA: Parotid neoplasm; technologist note states mass
below right ear

EXAM:
CT NECK WITHOUT CONTRAST
TECHNIQUE: Multidetector CT imaging of the neck was performed following the
standard protocol without intravenous contrast.

[Series 3: neck · axial · 0.52mm/px · z∈[-189,-101]mm · 2 of 132 slices shown, 3 images]
[im 44/132  soft-tissue]
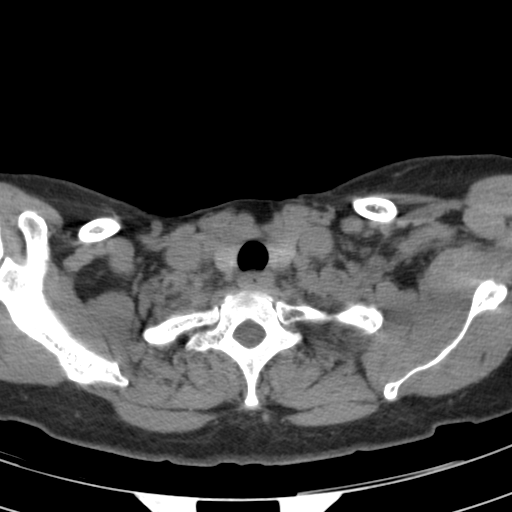
[im 44/132  bone]
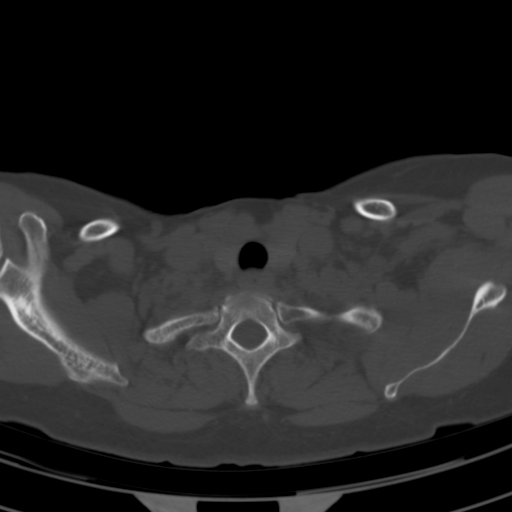
[im 88/132  bone]
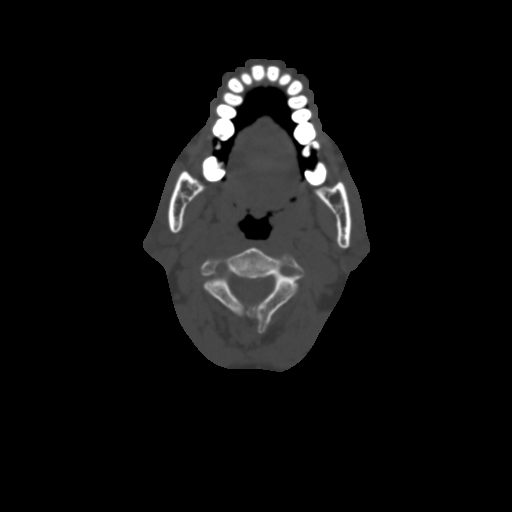

[Series 6: angled axial-oropharynx · axial · 0.39mm/px · z∈[-200,-115]mm · 2 of 131 slices shown]
[im 44/131  bone]
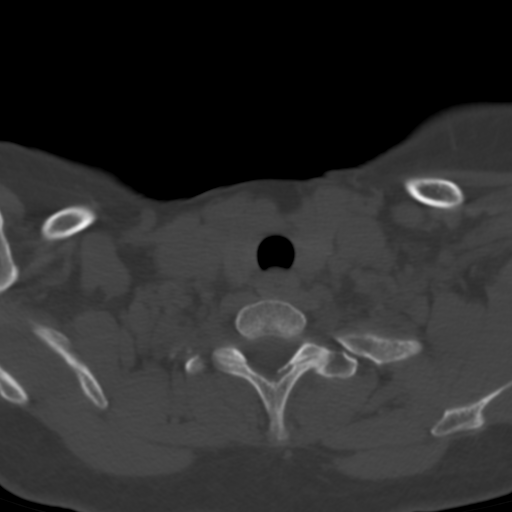
[im 87/131  bone]
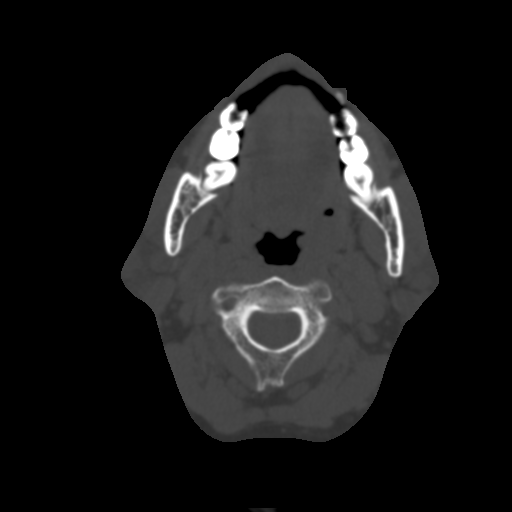

[Series 8: bone · axial · 0.41mm/px · z∈[-185,-99]mm · 2 of 130 slices shown]
[im 44/130  bone]
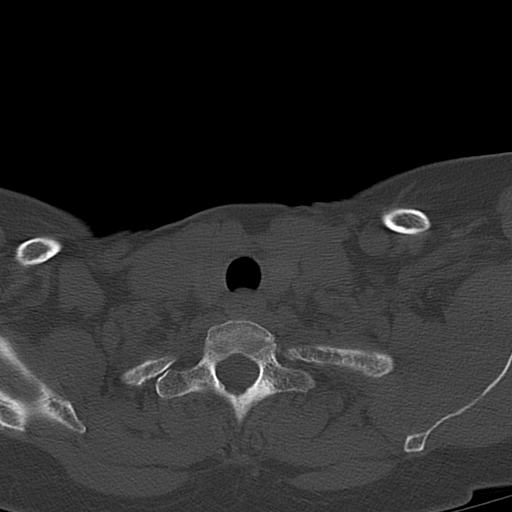
[im 87/130  bone]
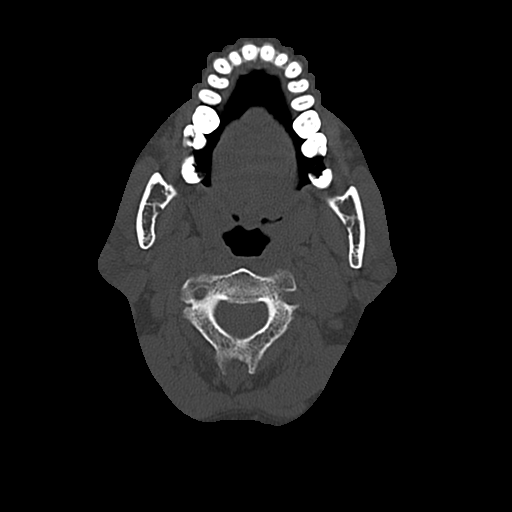

[6 of 14 positions shown; findings below may reference images not displayed]

FINDINGS: Pharynx and larynx: Unremarkable.  No mass or swelling.

Salivary glands: Within the superficial right parotid, there are
likely 2 adjacent masses. The larger more superior mass extends to
abut the skin surface with mild protrusion measuring approximately 2
x 2.2 x 3.1 cm. Smaller mass inferiorly measures 1.3 x 1.3 x 1.2 cm.
Left parotid is unremarkable. Submandibular glands are unremarkable.

Thyroid: 12 mm right thyroid nodule. No ultrasound follow-up is
recommended by current guidelines.

Lymph nodes: No enlarged or abnormal density nodes.

Vascular: No significant vascular abnormality on this noncontrast
study.

Limited intracranial: No acute abnormality.

Visualized orbits: Unremarkable.

Mastoids and visualized paranasal sinuses: No significant
opacification.

Skeleton: Cervical spine degenerative changes.

Upper chest: No apical lung mass.

Other: None.
IMPRESSION: Suspect two adjacent masses within the right parotid. Larger
measures up to 3 cm. Primary parotid neoplasm is the most likely
differential consideration and tissue sampling is recommended to
exclude malignancy.

## 2023-01-11 ENCOUNTER — Other Ambulatory Visit: Payer: Self-pay | Admitting: Obstetrics and Gynecology

## 2023-01-11 DIAGNOSIS — M7989 Other specified soft tissue disorders: Secondary | ICD-10-CM

## 2023-01-13 ENCOUNTER — Other Ambulatory Visit: Payer: BC Managed Care – PPO

## 2023-02-16 ENCOUNTER — Ambulatory Visit
Admission: RE | Admit: 2023-02-16 | Discharge: 2023-02-16 | Disposition: A | Discharge status: Home / Self Care | Payer: BC Managed Care – PPO | Source: Ambulatory Visit | Attending: Obstetrics and Gynecology | Admitting: Obstetrics and Gynecology

## 2023-02-16 DIAGNOSIS — M7989 Other specified soft tissue disorders: Secondary | ICD-10-CM

## 2023-04-04 ENCOUNTER — Ambulatory Visit: Payer: Self-pay | Admitting: Surgery

## 2023-04-18 ENCOUNTER — Encounter (HOSPITAL_BASED_OUTPATIENT_CLINIC_OR_DEPARTMENT_OTHER): Payer: Self-pay | Admitting: Surgery

## 2023-04-18 ENCOUNTER — Other Ambulatory Visit: Payer: Self-pay

## 2023-04-19 ENCOUNTER — Other Ambulatory Visit: Payer: Self-pay | Admitting: Internal Medicine

## 2023-04-19 DIAGNOSIS — Z1231 Encounter for screening mammogram for malignant neoplasm of breast: Secondary | ICD-10-CM

## 2023-04-26 NOTE — Anesthesia Preprocedure Evaluation (Signed)
Anesthesia Evaluation  Patient identified by MRN, date of birth, ID band Patient awake    Reviewed: Allergy & Precautions, NPO status , Patient's Chart, lab work & pertinent test results  Airway Mallampati: II  TM Distance: >3 FB Neck ROM: Full    Dental no notable dental hx. (+) Dental Advisory Given, Teeth Intact   Pulmonary neg pulmonary ROS   Pulmonary exam normal breath sounds clear to auscultation       Cardiovascular negative cardio ROS Normal cardiovascular exam Rhythm:Regular Rate:Normal     Neuro/Psych negative neurological ROS     GI/Hepatic Neg liver ROS,GERD  Medicated,,  Endo/Other  negative endocrine ROS    Renal/GU negative Renal ROS     Musculoskeletal negative musculoskeletal ROS (+)    Abdominal   Peds  Hematology  (+) Blood dyscrasia, anemia   Anesthesia Other Findings   Reproductive/Obstetrics                             Anesthesia Physical Anesthesia Plan  ASA: 2  Anesthesia Plan: MAC   Post-op Pain Management: Tylenol PO (pre-op)* and Regional block*   Induction: Intravenous  PONV Risk Score and Plan: Ondansetron, Dexamethasone, Treatment may vary due to age or medical condition and Midazolam  Airway Management Planned: Natural Airway  Additional Equipment:   Intra-op Plan:   Post-operative Plan:   Informed Consent: I have reviewed the patients History and Physical, chart, labs and discussed the procedure including the risks, benefits and alternatives for the proposed anesthesia with the patient or authorized representative who has indicated his/her understanding and acceptance.     Dental advisory given  Plan Discussed with: CRNA  Anesthesia Plan Comments:        Anesthesia Quick Evaluation

## 2023-04-27 ENCOUNTER — Ambulatory Visit (HOSPITAL_BASED_OUTPATIENT_CLINIC_OR_DEPARTMENT_OTHER): Payer: Self-pay | Admitting: Anesthesiology

## 2023-04-27 ENCOUNTER — Ambulatory Visit (HOSPITAL_BASED_OUTPATIENT_CLINIC_OR_DEPARTMENT_OTHER)
Admission: RE | Admit: 2023-04-27 | Discharge: 2023-04-27 | Disposition: A | Discharge status: Home / Self Care | Payer: BC Managed Care – PPO | Attending: Surgery | Admitting: Surgery

## 2023-04-27 ENCOUNTER — Ambulatory Visit (HOSPITAL_BASED_OUTPATIENT_CLINIC_OR_DEPARTMENT_OTHER): Payer: BC Managed Care – PPO | Admitting: Anesthesiology

## 2023-04-27 ENCOUNTER — Encounter (HOSPITAL_BASED_OUTPATIENT_CLINIC_OR_DEPARTMENT_OTHER): Payer: Self-pay | Admitting: Surgery

## 2023-04-27 ENCOUNTER — Other Ambulatory Visit: Payer: Self-pay

## 2023-04-27 ENCOUNTER — Encounter (HOSPITAL_BASED_OUTPATIENT_CLINIC_OR_DEPARTMENT_OTHER)
Admission: RE | Disposition: A | Discharge status: Home / Self Care | Payer: Self-pay | Source: Home / Self Care | Attending: Surgery

## 2023-04-27 DIAGNOSIS — Z01818 Encounter for other preprocedural examination: Secondary | ICD-10-CM

## 2023-04-27 DIAGNOSIS — K4090 Unilateral inguinal hernia, without obstruction or gangrene, not specified as recurrent: Secondary | ICD-10-CM | POA: Diagnosis not present

## 2023-04-27 HISTORY — PX: INGUINAL HERNIA REPAIR: SHX194

## 2023-04-27 SURGERY — REPAIR, HERNIA, INGUINAL, ADULT
Anesthesia: General | Site: Abdomen | Laterality: Left

## 2023-04-27 MED ORDER — ROCURONIUM BROMIDE 10 MG/ML (PF) SYRINGE
PREFILLED_SYRINGE | INTRAVENOUS | Status: AC
  Filled 2023-04-27: qty 10

## 2023-04-27 MED ORDER — OXYCODONE HCL 5 MG/5ML PO SOLN: 5.0000 mg | Freq: Once | ORAL | Status: DC | PRN

## 2023-04-27 MED ORDER — KETOROLAC TROMETHAMINE 30 MG/ML IJ SOLN
INTRAMUSCULAR | Status: AC
  Filled 2023-04-27: qty 1

## 2023-04-27 MED ORDER — FAMOTIDINE IN NACL 20-0.9 MG/50ML-% IV SOLN
INTRAVENOUS | Status: AC
  Filled 2023-04-27: qty 50

## 2023-04-27 MED ORDER — IBUPROFEN 800 MG PO TABS: 800.0000 mg | ORAL_TABLET | Freq: Three times a day (TID) | ORAL | 0 refills | Status: DC | PRN

## 2023-04-27 MED ORDER — BUPIVACAINE-EPINEPHRINE 0.5% -1:200000 IJ SOLN
INTRAMUSCULAR | Status: DC | PRN
  Administered 2023-04-27: 5 mL

## 2023-04-27 MED ORDER — ONDANSETRON HCL 4 MG/2ML IJ SOLN
INTRAMUSCULAR | Status: DC | PRN
Start: 2023-04-27 — End: 2023-04-27
  Administered 2023-04-27: 4 mg via INTRAVENOUS

## 2023-04-27 MED ORDER — SUCCINYLCHOLINE CHLORIDE 200 MG/10ML IV SOSY
PREFILLED_SYRINGE | INTRAVENOUS | Status: AC
  Filled 2023-04-27: qty 10

## 2023-04-27 MED ORDER — PROPOFOL 10 MG/ML IV BOLUS
INTRAVENOUS | Status: DC | PRN
  Administered 2023-04-27: 30 mg via INTRAVENOUS
  Administered 2023-04-27: 40 mg via INTRAVENOUS
  Administered 2023-04-27: 100 mg via INTRAVENOUS
  Administered 2023-04-27: 30 mg via INTRAVENOUS

## 2023-04-27 MED ORDER — KETOROLAC TROMETHAMINE 30 MG/ML IJ SOLN
INTRAMUSCULAR | Status: DC | PRN
  Administered 2023-04-27: 30 mg via INTRAVENOUS

## 2023-04-27 MED ORDER — CEFAZOLIN SODIUM-DEXTROSE 2-3 GM-%(50ML) IV SOLR
INTRAVENOUS | Status: DC | PRN
  Administered 2023-04-27: 2 g via INTRAVENOUS

## 2023-04-27 MED ORDER — MIDAZOLAM HCL 2 MG/2ML IJ SOLN
INTRAMUSCULAR | Status: AC
  Filled 2023-04-27: qty 2

## 2023-04-27 MED ORDER — BUPIVACAINE-EPINEPHRINE (PF) 0.25% -1:200000 IJ SOLN
INTRAMUSCULAR | Status: AC
  Filled 2023-04-27: qty 30

## 2023-04-27 MED ORDER — FENTANYL CITRATE (PF) 100 MCG/2ML IJ SOLN: 100.0000 ug | Freq: Once | INTRAMUSCULAR | Status: DC

## 2023-04-27 MED ORDER — BUPIVACAINE-EPINEPHRINE (PF) 0.5% -1:200000 IJ SOLN
INTRAMUSCULAR | Status: AC
  Filled 2023-04-27: qty 30

## 2023-04-27 MED ORDER — AMISULPRIDE (ANTIEMETIC) 5 MG/2ML IV SOLN: 10.0000 mg | Freq: Once | INTRAVENOUS | Status: DC | PRN

## 2023-04-27 MED ORDER — OXYCODONE HCL 5 MG PO TABS: 5.0000 mg | ORAL_TABLET | Freq: Once | ORAL | Status: DC | PRN

## 2023-04-27 MED ORDER — FENTANYL CITRATE (PF) 250 MCG/5ML IJ SOLN
INTRAMUSCULAR | Status: DC | PRN
  Administered 2023-04-27 (×2): 50 ug via INTRAVENOUS

## 2023-04-27 MED ORDER — OXYCODONE HCL 5 MG PO TABS: 5.0000 mg | ORAL_TABLET | Freq: Four times a day (QID) | ORAL | 0 refills | Status: DC | PRN

## 2023-04-27 MED ORDER — PROMETHAZINE HCL 25 MG/ML IJ SOLN: 6.2500 mg | INTRAMUSCULAR | Status: DC | PRN

## 2023-04-27 MED ORDER — LIDOCAINE 2% (20 MG/ML) 5 ML SYRINGE
INTRAMUSCULAR | Status: AC
  Filled 2023-04-27: qty 5

## 2023-04-27 MED ORDER — ACETAMINOPHEN 500 MG PO TABS
1000.0000 mg | ORAL_TABLET | Freq: Once | ORAL | Status: AC
  Administered 2023-04-27: 1000 mg via ORAL

## 2023-04-27 MED ORDER — ONDANSETRON HCL 4 MG/2ML IJ SOLN
INTRAMUSCULAR | Status: AC
  Filled 2023-04-27: qty 2

## 2023-04-27 MED ORDER — PROPOFOL 500 MG/50ML IV EMUL
INTRAVENOUS | Status: DC | PRN
  Administered 2023-04-27: 175 ug/kg/min via INTRAVENOUS
  Administered 2023-04-27: 125 ug/kg/min via INTRAVENOUS

## 2023-04-27 MED ORDER — MIDAZOLAM HCL 2 MG/2ML IJ SOLN
2.0000 mg | Freq: Once | INTRAMUSCULAR | Status: AC
  Administered 2023-04-27: 1 mg via INTRAVENOUS

## 2023-04-27 MED ORDER — METHOCARBAMOL 750 MG PO TABS: 750.0000 mg | ORAL_TABLET | Freq: Three times a day (TID) | ORAL | 0 refills | Status: DC | PRN

## 2023-04-27 MED ORDER — LACTATED RINGERS IV SOLN: INTRAVENOUS | Status: DC

## 2023-04-27 MED ORDER — ACETAMINOPHEN 500 MG PO TABS
ORAL_TABLET | ORAL | Status: AC
  Filled 2023-04-27: qty 2

## 2023-04-27 MED ORDER — HYDROMORPHONE HCL 1 MG/ML IJ SOLN: 0.2500 mg | INTRAMUSCULAR | Status: DC | PRN

## 2023-04-27 MED ORDER — CHLORHEXIDINE GLUCONATE CLOTH 2 % EX PADS: 6.0000 | MEDICATED_PAD | Freq: Once | CUTANEOUS | Status: DC

## 2023-04-27 MED ORDER — FENTANYL CITRATE (PF) 100 MCG/2ML IJ SOLN
INTRAMUSCULAR | Status: AC
  Filled 2023-04-27: qty 2

## 2023-04-27 MED ORDER — CEFAZOLIN SODIUM-DEXTROSE 2-4 GM/100ML-% IV SOLN
INTRAVENOUS | Status: AC
  Filled 2023-04-27: qty 100

## 2023-04-27 MED ORDER — FAMOTIDINE IN NACL 20-0.9 MG/50ML-% IV SOLN
20.0000 mg | Freq: Once | INTRAVENOUS | Status: AC
  Administered 2023-04-27: 20 mg via INTRAVENOUS

## 2023-04-27 MED ORDER — SUGAMMADEX SODIUM 200 MG/2ML IV SOLN
INTRAVENOUS | Status: DC | PRN
  Administered 2023-04-27: 200 mg via INTRAVENOUS

## 2023-04-27 MED ORDER — MEPERIDINE HCL 25 MG/ML IJ SOLN: 6.2500 mg | INTRAMUSCULAR | Status: DC | PRN

## 2023-04-27 MED ORDER — LIDOCAINE 2% (20 MG/ML) 5 ML SYRINGE
INTRAMUSCULAR | Status: DC | PRN
  Administered 2023-04-27: 60 mg via INTRAVENOUS

## 2023-04-27 MED ORDER — DEXAMETHASONE SODIUM PHOSPHATE 10 MG/ML IJ SOLN
INTRAMUSCULAR | Status: DC | PRN
  Administered 2023-04-27: 4 mg via INTRAVENOUS

## 2023-04-27 MED ORDER — ROCURONIUM BROMIDE 10 MG/ML (PF) SYRINGE
PREFILLED_SYRINGE | INTRAVENOUS | Status: DC | PRN
  Administered 2023-04-27: 40 mg via INTRAVENOUS

## 2023-04-27 MED ORDER — PROPOFOL 10 MG/ML IV BOLUS
INTRAVENOUS | Status: AC
  Filled 2023-04-27: qty 20

## 2023-04-27 SURGICAL SUPPLY — 48 items
ADH SKN CLS APL DERMABOND .7 (GAUZE/BANDAGES/DRESSINGS) ×1
APL PRP STRL LF DISP 70% ISPRP (MISCELLANEOUS) ×1
BLADE CLIPPER SURG (BLADE) IMPLANT
BLADE SURG 15 STRL LF DISP TIS (BLADE) ×1 IMPLANT
BLADE SURG 15 STRL SS (BLADE) ×1
CANISTER SUCT 1200ML W/VALVE (MISCELLANEOUS) IMPLANT
CHLORAPREP W/TINT 26 (MISCELLANEOUS) ×1 IMPLANT
COVER BACK TABLE 60X90IN (DRAPES) ×1 IMPLANT
COVER MAYO STAND STRL (DRAPES) ×1 IMPLANT
DERMABOND ADVANCED .7 DNX12 (GAUZE/BANDAGES/DRESSINGS) ×1 IMPLANT
DRAIN PENROSE .5X12 LATEX STL (DRAIN) ×1 IMPLANT
DRAPE LAPAROTOMY TRNSV 102X78 (DRAPES) ×1 IMPLANT
DRAPE UTILITY XL STRL (DRAPES) ×1 IMPLANT
ELECT COATED BLADE 2.86 ST (ELECTRODE) ×1 IMPLANT
ELECT REM PT RETURN 9FT ADLT (ELECTROSURGICAL) ×1
ELECTRODE REM PT RTRN 9FT ADLT (ELECTROSURGICAL) ×1 IMPLANT
GAUZE 4X4 16PLY ~~LOC~~+RFID DBL (SPONGE) IMPLANT
GAUZE SPONGE 4X4 12PLY STRL LF (GAUZE/BANDAGES/DRESSINGS) IMPLANT
GLOVE BIOGEL PI IND STRL 8 (GLOVE) ×1 IMPLANT
GLOVE ECLIPSE 8.0 STRL XLNG CF (GLOVE) ×1 IMPLANT
GOWN STRL REUS W/ TWL LRG LVL3 (GOWN DISPOSABLE) ×2 IMPLANT
GOWN STRL REUS W/ TWL XL LVL3 (GOWN DISPOSABLE) ×1 IMPLANT
GOWN STRL REUS W/TWL LRG LVL3 (GOWN DISPOSABLE) ×2
GOWN STRL REUS W/TWL XL LVL3 (GOWN DISPOSABLE) ×1
MESH HERNIA SYS ULTRAPRO LRG (Mesh General) IMPLANT
NDL HYPO 25X1 1.5 SAFETY (NEEDLE) ×1 IMPLANT
NEEDLE HYPO 25X1 1.5 SAFETY (NEEDLE) ×1 IMPLANT
NS IRRIG 1000ML POUR BTL (IV SOLUTION) IMPLANT
PACK BASIN DAY SURGERY FS (CUSTOM PROCEDURE TRAY) ×1 IMPLANT
PENCIL SMOKE EVACUATOR (MISCELLANEOUS) ×1 IMPLANT
SLEEVE SCD COMPRESS KNEE MED (STOCKING) ×1 IMPLANT
SPIKE FLUID TRANSFER (MISCELLANEOUS) IMPLANT
SPONGE T-LAP 4X18 ~~LOC~~+RFID (SPONGE) ×1 IMPLANT
STRIP CLOSURE SKIN 1/2X4 (GAUZE/BANDAGES/DRESSINGS) IMPLANT
SUT MON AB 4-0 PC3 18 (SUTURE) ×1 IMPLANT
SUT NOVA 0 T19/GS 22DT (SUTURE) IMPLANT
SUT NOVA NAB DX-16 0-1 5-0 T12 (SUTURE) ×2 IMPLANT
SUT VIC AB 2-0 SH 27 (SUTURE) ×1
SUT VIC AB 2-0 SH 27XBRD (SUTURE) ×1 IMPLANT
SUT VIC AB 3-0 54X BRD REEL (SUTURE) IMPLANT
SUT VIC AB 3-0 BRD 54 (SUTURE)
SUT VICRYL 3-0 CR8 SH (SUTURE) ×1 IMPLANT
SUT VICRYL AB 2 0 TIE (SUTURE) IMPLANT
SUT VICRYL AB 2 0 TIES (SUTURE)
SYR CONTROL 10ML LL (SYRINGE) ×1 IMPLANT
TOWEL GREEN STERILE FF (TOWEL DISPOSABLE) ×1 IMPLANT
TUBE CONNECTING 20X1/4 (TUBING) IMPLANT
YANKAUER SUCT BULB TIP NO VENT (SUCTIONS) IMPLANT

## 2023-04-27 NOTE — Interval H&P Note (Signed)
History and Physical Interval Note:  04/27/2023 3:15 PM  Anita Shaw  has presented today for surgery, with the diagnosis of LEFT INGUINAL HERNIA.  The various methods of treatment have been discussed with the patient and family. After consideration of risks, benefits and other options for treatment, the patient has consented to  Procedure(s) with comments: LEFT HERNIA REPAIR INGUINAL  WITH MESH (Left) - GEN/TAP BLOCK as a surgical intervention.  The patient's history has been reviewed, patient examined, no change in status, stable for surgery.  I have reviewed the patient's chart and labs.  Questions were answered to the patient's satisfaction.     Zeth Buday A Chace Klippel

## 2023-04-27 NOTE — Discharge Instructions (Addendum)
For further questions, please visit [email or web address]       [facility] _______[facility]  UMBILICAL OR INGUINAL HERNIA REPAIR: POST OP INSTRUCTIONS  Always review your discharge instruction sheet given to you by the facility where your surgery was performed. IF YOU HAVE DISABILITY OR FAMILY LEAVE FORMS, YOU MUST BRING THEM TO THE OFFICE FOR PROCESSING.   DO NOT GIVE THEM TO YOUR DOCTOR.  1. A  prescription for pain medication may be given to you upon discharge.  Take your pain medication as prescribed, if needed.  If narcotic pain medicine is not needed, then you may take acetaminophen (Tylenol) or ibuprofen (Advil) as needed. 2. Take your usually prescribed medications unless otherwise directed. If you need a refill on your pain medication, please contact your pharmacy.  They will contact our office to request authorization. Prescriptions will not be filled after 5 pm or on week-ends. 3. You should follow a light diet the first 24 hours after arrival home, such as soup and crackers, etc.  Be sure to include lots of fluids daily.  Resume your normal diet the day after surgery. 4.Most patients will experience some swelling and bruising around the umbilicus or in the groin and scrotum.  Ice packs and reclining will help.  Swelling and bruising can take several days to resolve.  6. It is common to experience some constipation if taking pain medication after surgery.  Increasing fluid intake and taking a stool softener (such as Colace) will usually help or prevent this problem from occurring.  A mild laxative (Milk of Magnesia or Miralax) should be taken according to package directions if there are no bowel movements after 48 hours. 7. Unless discharge instructions indicate otherwise, you may remove your bandages 24-48 hours after surgery, and you may shower at that time.  You may have steri-strips (small skin tapes) in place directly over the incision.  These strips should be left  on the skin for 7-10 days.  If your surgeon used skin glue on the incision, you may shower in 24 hours.  The glue will flake off over the next 2-3 weeks.  Any sutures or staples will be removed at the office during your follow-up visit. 8. ACTIVITIES:  You may resume regular (light) daily activities beginning the next day--such as daily self-care, walking, climbing stairs--gradually increasing activities as tolerated.  You may have sexual intercourse when it is comfortable.  Refrain from any heavy lifting or straining until approved by your doctor.  a.You may drive when you are no longer taking prescription pain medication, you can comfortably wear a seatbelt, and you can safely maneuver your car and apply brakes. b.RETURN TO WORK:   _____________________________________________  9.You should see your doctor in the office for a follow-up appointment approximately 2-3 weeks after your surgery.  Make sure that you call for this appointment within a day or two after you arrive home to insure a convenient appointment time. 10.OTHER INSTRUCTIONS: _________________________    _____________________________________  WHEN TO CALL YOUR DOCTOR: Fever over 101.0 Inability to urinate Nausea and/or vomiting Extreme swelling or bruising Continued bleeding from incision. Increased pain, redness, or drainage from the incision  The clinic staff is available to answer your questions during regular business hours.  Please don't hesitate to call and ask to speak to one of the nurses for clinical concerns.  If you have a medical emergency, go to the nearest emergency room or call 911.  A surgeon from [facility] is always on  call at the hospital   [address], [place], [place]  [address] ?  [address] [address], [place], [place]   [address] [phone] ? [phone] ? FAX [phone] Web site: [email or web address]    May take Tylenol after 7:30pm, if needed.  May take  NSAIDS (ibuprofen/motrin) after 11 pm, if needed.    Post Anesthesia Home Care Instructions  Activity: Get plenty of rest for the remainder of the day. A responsible individual must stay with you for 24 hours following the procedure.  For the next 24 hours, DO NOT: -Drive a car -Advertising copywriter -Drink alcoholic beverages -Take any medication unless instructed by your physician -Make any legal decisions or sign important papers.  Meals: Start with liquid foods such as gelatin or soup. Progress to regular foods as tolerated. Avoid greasy, spicy, heavy foods. If nausea and/or vomiting occur, drink only clear liquids until the nausea and/or vomiting subsides. Call your physician if vomiting continues.  Special Instructions/Symptoms: Your throat may feel dry or sore from the anesthesia or the breathing tube placed in your throat during surgery. If this causes discomfort, gargle with warm salt water. The discomfort should disappear within 24 hours.  If you had a scopolamine patch placed behind your ear for the management of post- operative nausea and/or vomiting:  1. The medication in the patch is effective for 72 hours, after which it should be removed.  Wrap patch in a tissue and discard in the trash. Wash hands thoroughly with soap and water. 2. You may remove the patch earlier than 72 hours if you experience unpleasant side effects which may include dry mouth, dizziness or visual disturbances. 3. Avoid touching the patch. Wash your hands with soap and water after contact with the patch.

## 2023-04-27 NOTE — Op Note (Signed)
Preoperative diagnosis: Reducible initial left inguinal hernia  Postoperative diagnosis: Same  Procedure repair of left inguinal hernia with mesh  Surgeon: Harriette Bouillon, MD  Anesthesia: General with transversus abdominis plane block and 0.5% plain Marcaine local  Drains none  IV fluids: Per anesthesia record  EBL: Minimal  Indications for procedure: The patient is a was found to have a bulge in her left groin.  She was evaluated by her PCP with a CT scan which showed a left inguinal we discussed options of laparoscopic and open repair.  We discussed use of mesh.  We discussed anesthesia modalities as well.  We discussed complications as well as recurrence rates and the use of mesh and potential chronic pain issues with that.The risk of hernia repair include bleeding,  Infection,   Recurrence of the hernia,  Mesh use, chronic pain,  Organ injury,  Bowel injury,  Bladder injury,   nerve injury with numbness around the incision,  Death,  and worsening of preexisting  medical problems.  The alternatives to surgery have been discussed as well..  Long term expectations of both operative and non operative treatments have been discussed.   The patient agrees to proceed.     Description of procedure: The patient was met in the holding area.  We discussed the case.  She was met with anesthesia.  We she underwent a left transversus abdominis plane block anesthesia.  Left groin was marked as correct site.  She was taken back to the operative room.  She is placed supine upon the OR table.  After induction of general anesthesia, left groin was prepped and draped in sterile fashion.  Timeout performed to verify proper patient, site and procedure.  A left inguinal incision was made through her previous lower Pfannenstiel incision.  Dissection was carried down through scar tissue through the subcutaneous fat into the aponeurosis of the external bleak was identified on the left.  A small incision was made in  this and Metzenbaum scissors were used to open the roof of the inguinal canal.  The round ligament was identified.  There is a hernia sac extended down to the level of the pubic tubercle.  I dissected the sac away from the round ligament and reduced this back into the preperitoneal space.  A large ultra primary system was used with the inner leaflet placed into the indirect defect and seated in the preperitoneal space.  The onlay was placed on the floor the inguinal canal.  This was secured to the shelving edge of the inguinal ligament, pubic tubercle and conjoined tendon with #1 Novafil suture.  The round ligament was divided and made hemostatic.  The mesh laid nicely.  The iliohypogastric nerves were retracted away out of the operative field.  There is no undue tension on the repair.  Hemostasis was excellent.  The aponeurosis of the external bleak was closed with 2-0 Vicryl.  3-0 Vicryl was used to approximate Scarpa's fascia and 4 Monocryl was used to close the skin in a subcuticular fashion.  Dermabond was applied.  All counts were found to be correct.  The patient was awoke extubated taken to recovery in satisfactory condition.

## 2023-04-27 NOTE — H&P (Signed)
History of Present Illness: Anita Shaw is a 62 y.o. female who is seen today as an office consultation for evaluation of New Consultation ( Left inguinal hernia)  Patient presents for evaluation of left groin bulge. It has been present for 2 years. He has gotten larger. A CT scan was performed to evaluate by her PCP which showed a left normal hernia. There are no complicating features. She has no complaints of pain but the bulge has gotten larger.  Review of Systems: A complete review of systems was obtained from the patient. I have reviewed this information and discussed as appropriate with the patient. See HPI as well for other ROS.    Medical History: Past Medical History:  Diagnosis Date  Arthritis  GERD (gastroesophageal reflux disease)   There is no problem list on file for this patient.  History reviewed. No pertinent surgical history.   No Known Allergies  Current Outpatient Medications on File Prior to Visit  Medication Sig Dispense Refill  fexofenadine (ALLEGRA) 180 MG tablet Take 180 mg by mouth once daily   No current facility-administered medications on file prior to visit.   History reviewed. No pertinent family history.   Social History   Tobacco Use  Smoking Status Never  Smokeless Tobacco Never    Social History   Socioeconomic History  Marital status: Married  Tobacco Use  Smoking status: Never  Smokeless tobacco: Never  Substance and Sexual Activity  Alcohol use: Not Currently  Drug use: Never   Objective:   Vitals:  04/04/23 0901 04/04/23 0902  BP: 109/70  Pulse: 53  Temp: 36.7 C (98 F)  SpO2: 98%  Weight: 71.5 kg (157 lb 9.6 oz)  Height: 162.6 cm (5\' 4" )  PainSc: 0-No pain   Body mass index is 27.05 kg/m.  Physical Exam HENT:  Head: Normocephalic.  Cardiovascular:  Rate and Rhythm: Normal rate.  Pulmonary:  Effort: Pulmonary effort is normal.  Breath sounds: No stridor.  Abdominal:  Tenderness: There is no abdominal  tenderness.  Hernia: A hernia is present. Hernia is present in the left inguinal area. There is no hernia in the right inguinal area.   Comments: Small reducible left inguinal hernia.  Skin: General: Skin is warm.  Neurological:  General: No focal deficit present.  Mental Status: She is alert.  Psychiatric:  Mood and Affect: Mood normal.     Labs, Imaging and Diagnostic Testing:  LINICAL DATA: Soft tissue mass in left mons pubis. * Tracking Code: BO *  EXAM: CT ABDOMEN AND PELVIS WITHOUT CONTRAST  TECHNIQUE: Multidetector CT imaging of the abdomen and pelvis was performed following the standard protocol without IV contrast.  RADIATION DOSE REDUCTION: This exam was performed according to the departmental dose-optimization program which includes automated exposure control, adjustment of the mA and/or kV according to patient size and/or use of iterative reconstruction technique.  COMPARISON: None Available.  FINDINGS: Lower chest: No acute findings.  Hepatobiliary: No mass visualized on this unenhanced exam. Gallbladder is unremarkable. No evidence of biliary ductal dilatation.  Pancreas: No mass or inflammatory process visualized on this unenhanced exam.  Spleen: Within normal limits in size.  Adrenals/Urinary tract: 3 mm calculus is seen in the upper pole of the left kidney. No evidence of ureteral calculi or hydronephrosis. Unremarkable unopacified urinary bladder.  Stomach/Bowel: No evidence of obstruction, inflammatory process, or abnormal fluid collections. Normal appendix visualized.  Vascular/Lymphatic: No pathologically enlarged lymph nodes identified. No evidence of abdominal aortic aneurysm.  Reproductive: Uterus and adnexal  regions are unremarkable in appearance.  Other: A small left inguinal hernia is seen which contains only fat, and this corresponds to the palpable abnormality indicated by the skin marker. No other soft tissue mass seen in the  region of the mons pubis or labia.  Musculoskeletal: No suspicious bone lesions identified.  IMPRESSION: Small left inguinal hernia, which contains only fat. No evidence of soft tissue mass.  Tiny left renal calculus. No evidence of ureteral calculi or hydronephrosis.   Electronically Signed By: Danae Orleans M.D. On: 02/18/2023 18:52 Assessment and Plan:   Diagnoses and all orders for this visit:  Left inguinal hernia   Discussed the pathophysiology of hernias and treatment options. Reviewed hernia repair with mesh as well as techniques. Discussed anesthesia techniques and she has questions about anesthesia therefore request to talk to the anesthesia team prior to surgery date. She has agreed to proceed with left inguinal hernia repair with mesh. Risk of bleeding, infection, nerve injury, blood vessel, the use of mesh, chronic pain, recurrence, organ injury, cardiovascular risk, blood clot, stroke, and rarely cardiovascular event.   Hayden Rasmussen, MD

## 2023-04-27 NOTE — Progress Notes (Signed)
Assisted Dr. Renold Don with left, transabdominal plane, ultrasound guided block. Side rails up, monitors on throughout procedure. See vital signs in flow sheet. Tolerated Procedure well.

## 2023-04-27 NOTE — Transfer of Care (Signed)
Immediate Anesthesia Transfer of Care Note  Patient: Anita Shaw  Procedure(s) Performed: LEFT HERNIA REPAIR INGUINAL  WITH MESH (Left: Abdomen)  Patient Location: PACU  Anesthesia Type:General  Level of Consciousness: awake and patient cooperative  Airway & Oxygen Therapy: Patient Spontanous Breathing and Patient connected to face mask oxygen  Post-op Assessment: Report given to RN and Post -op Vital signs reviewed and stable  Post vital signs: Reviewed and stable  Last Vitals:  Vitals Value Taken Time  BP 106/65 04/27/23 1715  Temp 36.3 C 04/27/23 1709  Pulse 55 04/27/23 1717  Resp 15 04/27/23 1717  SpO2 96 % 04/27/23 1717  Vitals shown include unfiled device data.  Last Pain:  Vitals:   04/27/23 1709  PainSc: Asleep         Complications: No notable events documented.

## 2023-04-27 NOTE — Anesthesia Procedure Notes (Signed)
Procedure Name: Intubation Date/Time: 04/27/2023 3:57 PM  Performed by: Demetrio Lapping, CRNAPre-anesthesia Checklist: Patient identified, Emergency Drugs available, Suction available and Patient being monitored Patient Re-evaluated:Patient Re-evaluated prior to induction Oxygen Delivery Method: Circle System Utilized Preoxygenation: Pre-oxygenation with 100% oxygen Induction Type: IV induction, Rapid sequence and Cricoid Pressure applied Laryngoscope Size: Mac and 3 Grade View: Grade I Tube type: Oral Tube size: 7.0 mm Number of attempts: 1 Airway Equipment and Method: Stylet Placement Confirmation: ETT inserted through vocal cords under direct vision, positive ETCO2 and breath sounds checked- equal and bilateral Secured at: 22 cm Tube secured with: Tape Dental Injury: Teeth and Oropharynx as per pre-operative assessment

## 2023-04-28 ENCOUNTER — Encounter (HOSPITAL_BASED_OUTPATIENT_CLINIC_OR_DEPARTMENT_OTHER): Payer: Self-pay | Admitting: Surgery

## 2023-04-28 NOTE — Anesthesia Postprocedure Evaluation (Signed)
Anesthesia Post Note  Patient: Anita Shaw  Procedure(s) Performed: LEFT HERNIA REPAIR INGUINAL  WITH MESH (Left: Abdomen)     Patient location during evaluation: PACU Anesthesia Type: General Level of consciousness: sedated and patient cooperative Pain management: pain level controlled Vital Signs Assessment: post-procedure vital signs reviewed and stable Respiratory status: spontaneous breathing Cardiovascular status: stable Anesthetic complications: no   No notable events documented.  Last Vitals:  Vitals:   04/27/23 1730 04/27/23 1742  BP: (!) 102/53 (!) 112/56  Pulse: 60 (!) 55  Resp: 15 16  Temp:  (!) 36.3 C  SpO2: 98% 98%    Last Pain:  Vitals:   04/28/23 0958  PainSc: 0-No pain                 Lewie Loron

## 2023-05-13 ENCOUNTER — Ambulatory Visit
Admission: RE | Admit: 2023-05-13 | Discharge: 2023-05-13 | Disposition: A | Discharge status: Home / Self Care | Payer: BC Managed Care – PPO | Source: Ambulatory Visit | Attending: Internal Medicine | Admitting: Internal Medicine

## 2023-05-13 DIAGNOSIS — Z1231 Encounter for screening mammogram for malignant neoplasm of breast: Secondary | ICD-10-CM

## 2023-09-11 ENCOUNTER — Other Ambulatory Visit: Payer: Self-pay

## 2023-09-11 ENCOUNTER — Emergency Department (HOSPITAL_COMMUNITY)
Admission: EM | Admit: 2023-09-11 | Discharge: 2023-09-11 | Disposition: A | Discharge status: Home / Self Care | Payer: BC Managed Care – PPO | Attending: Emergency Medicine | Admitting: Emergency Medicine

## 2023-09-11 DIAGNOSIS — R110 Nausea: Secondary | ICD-10-CM | POA: Diagnosis not present

## 2023-09-11 DIAGNOSIS — R42 Dizziness and giddiness: Secondary | ICD-10-CM | POA: Diagnosis present

## 2023-09-11 LAB — URINALYSIS, ROUTINE W REFLEX MICROSCOPIC
Bacteria, UA: NONE SEEN
Bilirubin Urine: NEGATIVE
Glucose, UA: NEGATIVE mg/dL
Hgb urine dipstick: NEGATIVE
Ketones, ur: 5 mg/dL — AB
Nitrite: NEGATIVE
Protein, ur: NEGATIVE mg/dL
Specific Gravity, Urine: 1.02 (ref 1.005–1.030)
pH: 5 (ref 5.0–8.0)

## 2023-09-11 LAB — CBC
HCT: 39.5 % (ref 36.0–46.0)
Hemoglobin: 12.7 g/dL (ref 12.0–15.0)
MCH: 29.1 pg (ref 26.0–34.0)
MCHC: 32.2 g/dL (ref 30.0–36.0)
MCV: 90.6 fL (ref 80.0–100.0)
Platelets: 216 10*3/uL (ref 150–400)
RBC: 4.36 MIL/uL (ref 3.87–5.11)
RDW: 13.2 % (ref 11.5–15.5)
WBC: 6.3 10*3/uL (ref 4.0–10.5)
nRBC: 0 % (ref 0.0–0.2)

## 2023-09-11 LAB — BASIC METABOLIC PANEL
Anion gap: 8 (ref 5–15)
BUN: 16 mg/dL (ref 8–23)
CO2: 23 mmol/L (ref 22–32)
Calcium: 8.7 mg/dL — ABNORMAL LOW (ref 8.9–10.3)
Chloride: 103 mmol/L (ref 98–111)
Creatinine, Ser: 0.7 mg/dL (ref 0.44–1.00)
GFR, Estimated: >60 mL/min (ref >60)
Glucose, Bld: 116 mg/dL — ABNORMAL HIGH (ref 70–99)
Potassium: 3.4 mmol/L — ABNORMAL LOW (ref 3.5–5.1)
Sodium: 134 mmol/L — ABNORMAL LOW (ref 135–145)

## 2023-09-11 LAB — TROPONIN I (HIGH SENSITIVITY)
Troponin I (High Sensitivity): 3 ng/L (ref <18)
Troponin I (High Sensitivity): 3 ng/L (ref <18)

## 2023-09-11 LAB — CBG MONITORING, ED: Glucose-Capillary: 87 mg/dL (ref 70–99)

## 2023-09-11 MED ORDER — ALUM & MAG HYDROXIDE-SIMETH 200-200-20 MG/5ML PO SUSP
30.0000 mL | Freq: Once | ORAL | Status: AC
  Administered 2023-09-11: 30 mL via ORAL
  Filled 2023-09-11: qty 30

## 2023-09-11 MED ORDER — SODIUM CHLORIDE 0.9 % IV BOLUS
1000.0000 mL | Freq: Once | INTRAVENOUS | Status: AC
  Administered 2023-09-11: 1000 mL via INTRAVENOUS

## 2023-09-11 MED ORDER — MECLIZINE HCL 25 MG PO TABS
25.0000 mg | ORAL_TABLET | Freq: Once | ORAL | Status: DC
  Filled 2023-09-11: qty 1

## 2023-09-11 MED ORDER — MECLIZINE HCL 25 MG PO TABS: 25.0000 mg | ORAL_TABLET | Freq: Three times a day (TID) | ORAL | 0 refills | Status: AC | PRN

## 2023-09-11 MED ORDER — ONDANSETRON HCL 4 MG/2ML IJ SOLN
4.0000 mg | Freq: Once | INTRAMUSCULAR | Status: AC
  Administered 2023-09-11: 4 mg via INTRAVENOUS
  Filled 2023-09-11: qty 2

## 2023-09-11 NOTE — ED Triage Notes (Signed)
Pt arrived via POV. C/o dizziness and nausea that began after they bent over at 1015 today. Has not improved. No focal deficits noted.  AOx4

## 2023-09-11 NOTE — ED Provider Notes (Signed)
[place] EMERGENCY DEPARTMENT AT [facility] Provider Note   CSN: [id number] Arrival date & time: 09/11/23  1110     History  Chief Complaint  Patient presents with   Dizziness   Nausea    Anita Shaw is a 62 y.o. female with history of GERD presents with complaints of dizziness and nausea that started this morning.  She states she bent over to pick up some Kleenex and her symptoms came on suddenly.  She denies any abdominal pain, chest pain or shortness of breath.  She has had no episodes of vomiting.  No fevers or chills, cough or congestion.  She is curious whether her nausea is related to her acid reflux as she describes some, burning deep in her chest.  No cardiac history.  Dizziness is constant does not appear to be worse with movement.  No history of vertigo, she is not on blood thinners.  No fall.    Dizziness      Home Medications Prior to Admission medications   Medication Sig Start Date End Date Taking? Authorizing Provider  acetaminophen (TYLENOL) 500 MG tablet Take 500-1,000 mg by mouth every 6 (six) hours as needed (pain.).   Yes [provider]  Cholecalciferol (VITAMIN D) 2000 units CAPS Take 2,000 Units by mouth every evening.   Yes [provider]  fexofenadine (ALLEGRA) 180 MG tablet Take 180 mg by mouth daily.   Yes [provider]  meclizine (ANTIVERT) 25 MG tablet Take 1 tablet (25 mg total) by mouth 3 (three) times daily as needed for dizziness. 09/11/23  Yes Halford Decamp, PA-C      Allergies    Patient has no known allergies.    Review of Systems   Review of Systems  Neurological:  Positive for dizziness.    Physical Exam Updated Vital Signs BP 124/77 (BP Location: Right Arm)   Pulse 69   Temp 97.8 F (36.6 C) (Oral)   Resp 20   Ht 5\' 5"  (1.651 m)   Wt 70.3 kg   SpO2 100%   BMI 25.79 kg/m  Physical Exam Vitals and nursing note reviewed.  Constitutional:      General: She is not in acute  distress.    Appearance: She is well-developed.  HENT:     Head: Normocephalic and atraumatic.  Eyes:     Conjunctiva/sclera: Conjunctivae normal.  Cardiovascular:     Rate and Rhythm: Normal rate and regular rhythm.     Heart sounds: No murmur heard. Pulmonary:     Effort: Pulmonary effort is normal. No respiratory distress.     Breath sounds: Normal breath sounds.  Abdominal:     Palpations: Abdomen is soft.     Tenderness: There is no abdominal tenderness.  Musculoskeletal:        General: No swelling.     Cervical back: Neck supple.  Skin:    General: Skin is warm and dry.     Capillary Refill: Capillary refill takes less than 2 seconds.  Neurological:     General: No focal deficit present.     Mental Status: She is alert and oriented to person, place, and time.     Cranial Nerves: No cranial nerve deficit.     Motor: No weakness.  Psychiatric:        Mood and Affect: Mood normal.     ED Results / Procedures / Treatments   Labs (all labs ordered are listed, but only abnormal results are  displayed) Labs Reviewed  BASIC METABOLIC PANEL - Abnormal; Notable for the following components:      Result Value   Sodium 134 (*)    Potassium 3.4 (*)    Glucose, Bld 116 (*)    Calcium 8.7 (*)    All other components within normal limits  URINALYSIS, ROUTINE W REFLEX MICROSCOPIC - Abnormal; Notable for the following components:   Ketones, ur 5 (*)    Leukocytes,Ua SMALL (*)    All other components within normal limits  CBC  CBG MONITORING, ED  TROPONIN I (HIGH SENSITIVITY)  TROPONIN I (HIGH SENSITIVITY)    EKG EKG Interpretation Date/Time:  Sunday September 11 2023 11:16:42 EST Ventricular Rate:  53 PR Interval:  174 QRS Duration:  112 QT Interval:  473 QTC Calculation: 445 R Axis:   67  Text Interpretation: Sinus rhythm Probable left atrial enlargement Borderline intraventricular conduction delay Borderline T abnormalities, anterior leads Confirmed by Alona Bene  [phone]) on 09/11/2023 11:19:22 AM  Radiology No results found.  Procedures Procedures    Medications Ordered in ED Medications  meclizine (ANTIVERT) tablet 25 mg (has no administration in time range)  alum & mag hydroxide-simeth (MAALOX/MYLANTA) 200-200-20 MG/5ML suspension 30 mL (30 mLs Oral Given 09/11/23 1200)  ondansetron (ZOFRAN) injection 4 mg (4 mg Intravenous Given 09/11/23 1201)  sodium chloride 0.9 % bolus 1,000 mL (0 mLs Intravenous Stopped 09/11/23 1447)    ED Course/ Medical Decision Making/ A&P                                 Medical Decision Making Amount and/or Complexity of Data Reviewed Labs: ordered.  Risk OTC drugs. Prescription drug management.   This patient presents to the ED with chief complaint(s) of dizziness and nausea.  The complaint involves an extensive differential diagnosis and also carries with it a high risk of complications and morbidity.   pertinent past medical history as listed in HPI  The differential diagnosis includes  ACS, cholecystitis, acid reflux, central versus peripheral vertigo The initial plan is to  Will obtain basic labs, GI cocktail and liter of fluids Additional history obtained: Additional history obtained from spouse Records reviewed previous admission documents  Initial Assessment:   Patient is hemodynamically stable, afebrile.  Nonfocal abdominal exam.  No emesis.  Neuroexam without any deficits.  Low suspicion for any surgical abdominal pathology.  She has no chest pain, shortness of breath.  Overall low suspicion for ACS.  She does have a history of GERD, suspect this could be a component.   Independent ECG interpretation:  Sinus rhythm without ischemic changes  Independent labs interpretation:  The following labs were independently interpreted:  CBG 87, BMP with mild hyponatremia and hypokalemia, first troponin without elevation, CBC unremarkable  Independent visualization and interpretation of  imaging: None Treatment and Reassessment: Patient given GI cocktail and Zofran following first assessment.  1 PM patient reported marked improvement in her symptoms.  Will p.o. challenge and ambulate  1:30 PM patient reported recurrence of dizziness when using her walker to go to the restroom.  3:35 PM patient reports complete improvement of her symptoms.  She was able to go use the restroom without any dizziness.  She states she is ready to go home.  Requests prescription for meclizine.  Consultations obtained:   None  Disposition:   Patient will be discharged home.  Given small dose of meclizine should any recurrence of symptoms  occur.  Encouraged to follow-up with PCP by the end of the week. The patient has been appropriately medically screened and/or stabilized in the ED. I have low suspicion for any other emergent medical condition which would require further screening, evaluation or treatment in the ED or require inpatient management. At time of discharge the patient is hemodynamically stable and in no acute distress. I have discussed work-up results and diagnosis with patient and answered all questions. Patient is agreeable with discharge plan. We discussed strict return precautions for returning to the emergency department and they verbalized understanding.     Social Determinants of Health:   none  This note was dictated with voice recognition software.  Despite best efforts at proofreading, errors may have occurred which can change the documentation meaning.          Final Clinical Impression(s) / ED Diagnoses Final diagnoses:  Nausea  Dizziness    Rx / DC Orders ED Discharge Orders          Ordered    meclizine (ANTIVERT) 25 MG tablet  3 times daily PRN        09/11/23 1543              Halford Decamp, PA-C 09/11/23 1624    Maia Plan, MD 09/13/23 1243

## 2023-09-11 NOTE — Discharge Instructions (Signed)
It was a pleasure taking care of you today.  You were evaluated in the emergency room for dizziness and nausea.  Your lab work was entirely normal.  You were given IV fluids and medication for indigestion.  Small prescription of meclizine was sent into your pharmacy.  Please use this should you experience any recurrence of dizziness.  Should you experience any new or worsening symptoms such as persistent extreme dizziness, vomiting, difficulty balancing please return to the emergency room.  Otherwise please be sure to schedule an appointment with your PCP within the next 3 to 5 days.

## 2024-05-31 ENCOUNTER — Other Ambulatory Visit: Payer: Self-pay | Admitting: Internal Medicine

## 2024-05-31 DIAGNOSIS — Z1231 Encounter for screening mammogram for malignant neoplasm of breast: Secondary | ICD-10-CM

## 2024-06-15 ENCOUNTER — Ambulatory Visit
Admission: RE | Admit: 2024-06-15 | Discharge: 2024-06-15 | Disposition: A | Discharge status: Home / Self Care | Payer: Self-pay | Source: Ambulatory Visit | Attending: Internal Medicine | Admitting: Internal Medicine

## 2024-06-15 DIAGNOSIS — Z1231 Encounter for screening mammogram for malignant neoplasm of breast: Secondary | ICD-10-CM
